# Patient Record
Sex: Female | Born: 1935 | Race: White | Hispanic: No | Marital: Married | Smoking: Never smoker
Health system: Southern US, Community
[De-identification: ages and names within clinical notes are randomized; demographics above are authoritative.]

## PROBLEM LIST (undated history)

## (undated) DIAGNOSIS — R0789 Other chest pain: Secondary | ICD-10-CM

## (undated) DIAGNOSIS — F09 Unspecified mental disorder due to known physiological condition: Secondary | ICD-10-CM

## (undated) DIAGNOSIS — I4891 Unspecified atrial fibrillation: Secondary | ICD-10-CM

## (undated) DIAGNOSIS — I639 Cerebral infarction, unspecified: Secondary | ICD-10-CM

## (undated) DIAGNOSIS — I4510 Unspecified right bundle-branch block: Secondary | ICD-10-CM

## (undated) DIAGNOSIS — C50919 Malignant neoplasm of unspecified site of unspecified female breast: Secondary | ICD-10-CM

## (undated) DIAGNOSIS — I272 Pulmonary hypertension, unspecified: Secondary | ICD-10-CM

## (undated) DIAGNOSIS — Z952 Presence of prosthetic heart valve: Secondary | ICD-10-CM

## (undated) DIAGNOSIS — Z8679 Personal history of other diseases of the circulatory system: Secondary | ICD-10-CM

## (undated) HISTORY — DX: Other chest pain: R07.89

## (undated) HISTORY — DX: Unspecified right bundle-branch block: I45.10

## (undated) HISTORY — DX: Cerebral infarction, unspecified: I63.9

## (undated) HISTORY — DX: Pulmonary hypertension, unspecified: I27.20

## (undated) HISTORY — DX: Unspecified mental disorder due to known physiological condition: F09

## (undated) HISTORY — DX: Presence of prosthetic heart valve: Z95.2

## (undated) HISTORY — PX: BREAST LUMPECTOMY: SHX2

## (undated) HISTORY — DX: Unspecified atrial fibrillation: I48.91

## (undated) HISTORY — DX: Personal history of other diseases of the circulatory system: Z86.79

## (undated) HISTORY — DX: Malignant neoplasm of unspecified site of unspecified female breast: C50.919

---

## 1983-01-11 HISTORY — PX: CHOLECYSTECTOMY: SHX55

## 1990-01-10 HISTORY — PX: MITRAL VALVE REPLACEMENT: SHX147

## 1997-11-21 ENCOUNTER — Encounter: Payer: Self-pay | Admitting: Specialist

## 1997-11-26 ENCOUNTER — Ambulatory Visit (HOSPITAL_COMMUNITY): Admission: RE | Admit: 1997-11-26 | Discharge: 1997-11-26 | Payer: Self-pay | Admitting: Specialist

## 1998-02-24 ENCOUNTER — Other Ambulatory Visit: Admission: RE | Admit: 1998-02-24 | Discharge: 1998-02-24 | Payer: Self-pay | Admitting: Obstetrics and Gynecology

## 1998-03-24 ENCOUNTER — Other Ambulatory Visit: Admission: RE | Admit: 1998-03-24 | Discharge: 1998-03-24 | Payer: Self-pay | Admitting: Surgery

## 1998-04-07 ENCOUNTER — Inpatient Hospital Stay (HOSPITAL_COMMUNITY): Admission: RE | Admit: 1998-04-07 | Discharge: 1998-04-16 | Payer: Self-pay | Admitting: Surgery

## 1998-04-07 ENCOUNTER — Encounter: Payer: Self-pay | Admitting: Surgery

## 1998-04-08 ENCOUNTER — Encounter: Payer: Self-pay | Admitting: Surgery

## 1998-04-10 ENCOUNTER — Encounter: Payer: Self-pay | Admitting: Surgery

## 1998-04-23 ENCOUNTER — Encounter: Admission: RE | Admit: 1998-04-23 | Discharge: 1998-07-22 | Payer: Self-pay | Admitting: Radiation Oncology

## 1999-03-02 ENCOUNTER — Other Ambulatory Visit: Admission: RE | Admit: 1999-03-02 | Discharge: 1999-03-02 | Payer: Self-pay | Admitting: Obstetrics and Gynecology

## 1999-06-16 ENCOUNTER — Ambulatory Visit (HOSPITAL_COMMUNITY): Admission: RE | Admit: 1999-06-16 | Discharge: 1999-06-16 | Payer: Self-pay | Admitting: Specialist

## 2000-01-06 ENCOUNTER — Encounter (INDEPENDENT_AMBULATORY_CARE_PROVIDER_SITE_OTHER): Payer: Self-pay | Admitting: Specialist

## 2000-01-06 ENCOUNTER — Ambulatory Visit (HOSPITAL_COMMUNITY): Admission: RE | Admit: 2000-01-06 | Discharge: 2000-01-06 | Payer: Self-pay | Admitting: *Deleted

## 2000-03-06 ENCOUNTER — Other Ambulatory Visit: Admission: RE | Admit: 2000-03-06 | Discharge: 2000-03-06 | Payer: Self-pay | Admitting: Obstetrics and Gynecology

## 2001-03-12 ENCOUNTER — Other Ambulatory Visit: Admission: RE | Admit: 2001-03-12 | Discharge: 2001-03-12 | Payer: Self-pay | Admitting: Obstetrics and Gynecology

## 2001-05-15 ENCOUNTER — Encounter (INDEPENDENT_AMBULATORY_CARE_PROVIDER_SITE_OTHER): Payer: Self-pay | Admitting: *Deleted

## 2001-05-15 ENCOUNTER — Encounter: Payer: Self-pay | Admitting: Surgery

## 2001-05-15 ENCOUNTER — Other Ambulatory Visit: Admission: RE | Admit: 2001-05-15 | Discharge: 2001-05-15 | Payer: Self-pay | Admitting: Diagnostic Radiology

## 2001-05-15 ENCOUNTER — Encounter: Admission: RE | Admit: 2001-05-15 | Discharge: 2001-05-15 | Payer: Self-pay | Admitting: Surgery

## 2001-06-10 ENCOUNTER — Encounter (INDEPENDENT_AMBULATORY_CARE_PROVIDER_SITE_OTHER): Payer: Self-pay | Admitting: *Deleted

## 2001-06-10 ENCOUNTER — Inpatient Hospital Stay (HOSPITAL_COMMUNITY): Admission: RE | Admit: 2001-06-10 | Discharge: 2001-06-12 | Payer: Self-pay | Admitting: Surgery

## 2001-06-10 ENCOUNTER — Encounter: Payer: Self-pay | Admitting: Cardiovascular Disease

## 2001-06-11 ENCOUNTER — Encounter: Payer: Self-pay | Admitting: Cardiovascular Disease

## 2001-06-25 ENCOUNTER — Ambulatory Visit: Admission: RE | Admit: 2001-06-25 | Discharge: 2001-09-23 | Payer: Self-pay | Admitting: Radiation Oncology

## 2001-10-24 ENCOUNTER — Ambulatory Visit: Admission: RE | Admit: 2001-10-24 | Discharge: 2001-10-24 | Payer: Self-pay | Admitting: Radiation Oncology

## 2004-10-14 ENCOUNTER — Ambulatory Visit: Payer: Self-pay | Admitting: Internal Medicine

## 2004-12-16 ENCOUNTER — Ambulatory Visit: Payer: Self-pay | Admitting: Oncology

## 2005-01-13 ENCOUNTER — Inpatient Hospital Stay (HOSPITAL_COMMUNITY): Admission: AD | Admit: 2005-01-13 | Discharge: 2005-01-30 | Payer: Self-pay | Admitting: Cardiovascular Disease

## 2005-01-14 ENCOUNTER — Ambulatory Visit: Payer: Self-pay | Admitting: Oncology

## 2005-01-14 ENCOUNTER — Encounter (INDEPENDENT_AMBULATORY_CARE_PROVIDER_SITE_OTHER): Payer: Self-pay | Admitting: *Deleted

## 2005-06-02 ENCOUNTER — Ambulatory Visit: Payer: Self-pay | Admitting: Oncology

## 2005-11-17 ENCOUNTER — Ambulatory Visit: Payer: Self-pay | Admitting: Oncology

## 2006-05-04 ENCOUNTER — Ambulatory Visit: Payer: Self-pay | Admitting: Oncology

## 2008-12-15 ENCOUNTER — Encounter: Admission: RE | Admit: 2008-12-15 | Discharge: 2008-12-15 | Payer: Self-pay | Admitting: Cardiovascular Disease

## 2008-12-24 DIAGNOSIS — R0789 Other chest pain: Secondary | ICD-10-CM

## 2008-12-24 HISTORY — DX: Other chest pain: R07.89

## 2010-03-25 ENCOUNTER — Other Ambulatory Visit: Payer: Self-pay | Admitting: Cardiovascular Disease

## 2010-03-25 ENCOUNTER — Ambulatory Visit
Admission: RE | Admit: 2010-03-25 | Discharge: 2010-03-25 | Disposition: A | Discharge status: Home / Self Care | Payer: Medicare Other | Source: Ambulatory Visit | Attending: Cardiovascular Disease | Admitting: Cardiovascular Disease

## 2010-03-25 DIAGNOSIS — R0602 Shortness of breath: Secondary | ICD-10-CM

## 2010-04-05 ENCOUNTER — Inpatient Hospital Stay (HOSPITAL_COMMUNITY)
Admission: AD | Admit: 2010-04-05 | Discharge: 2010-04-12 | DRG: 292 | Disposition: A | Discharge status: Home Health | Payer: Medicare Other | Source: Ambulatory Visit | Attending: Cardiovascular Disease | Admitting: Cardiovascular Disease

## 2010-04-05 ENCOUNTER — Inpatient Hospital Stay (HOSPITAL_COMMUNITY): Payer: Medicare Other

## 2010-04-05 DIAGNOSIS — I5023 Acute on chronic systolic (congestive) heart failure: Principal | ICD-10-CM | POA: Diagnosis present

## 2010-04-05 DIAGNOSIS — F0390 Unspecified dementia without behavioral disturbance: Secondary | ICD-10-CM | POA: Diagnosis present

## 2010-04-05 DIAGNOSIS — N183 Chronic kidney disease, stage 3 unspecified: Secondary | ICD-10-CM | POA: Diagnosis present

## 2010-04-05 DIAGNOSIS — E876 Hypokalemia: Secondary | ICD-10-CM | POA: Diagnosis present

## 2010-04-05 DIAGNOSIS — R188 Other ascites: Secondary | ICD-10-CM | POA: Diagnosis present

## 2010-04-05 DIAGNOSIS — Z8673 Personal history of transient ischemic attack (TIA), and cerebral infarction without residual deficits: Secondary | ICD-10-CM

## 2010-04-05 DIAGNOSIS — Z23 Encounter for immunization: Secondary | ICD-10-CM

## 2010-04-05 DIAGNOSIS — I4891 Unspecified atrial fibrillation: Secondary | ICD-10-CM | POA: Diagnosis present

## 2010-04-05 DIAGNOSIS — H3530 Unspecified macular degeneration: Secondary | ICD-10-CM | POA: Diagnosis present

## 2010-04-05 DIAGNOSIS — D649 Anemia, unspecified: Secondary | ICD-10-CM | POA: Diagnosis present

## 2010-04-05 DIAGNOSIS — Z7901 Long term (current) use of anticoagulants: Secondary | ICD-10-CM

## 2010-04-05 DIAGNOSIS — I509 Heart failure, unspecified: Secondary | ICD-10-CM | POA: Diagnosis present

## 2010-04-05 DIAGNOSIS — K7460 Unspecified cirrhosis of liver: Secondary | ICD-10-CM | POA: Diagnosis present

## 2010-04-05 DIAGNOSIS — I4510 Unspecified right bundle-branch block: Secondary | ICD-10-CM | POA: Diagnosis present

## 2010-04-05 DIAGNOSIS — Z853 Personal history of malignant neoplasm of breast: Secondary | ICD-10-CM

## 2010-04-05 LAB — AMMONIA: Ammonia: 16 umol/L (ref 11–35)

## 2010-04-05 LAB — DIFFERENTIAL
Basophils Absolute: 0.1 10*3/uL (ref 0.0–0.1)
Basophils Relative: 1 % (ref 0–1)
Basophils Relative: 1 % (ref 0–1)
Lymphocytes Relative: 11 % — ABNORMAL LOW (ref 12–46)
Lymphocytes Relative: 14 % (ref 12–46)
Monocytes Absolute: 0.8 10*3/uL (ref 0.1–1.0)
Monocytes Relative: 15 % — ABNORMAL HIGH (ref 3–12)
Monocytes Relative: 16 % — ABNORMAL HIGH (ref 3–12)
Neutro Abs: 3.3 10*3/uL (ref 1.7–7.7)
Neutro Abs: 3.6 10*3/uL (ref 1.7–7.7)
Neutrophils Relative %: 68 % (ref 43–77)
Neutrophils Relative %: 68 % (ref 43–77)

## 2010-04-05 LAB — CBC
HCT: 35.6 % — ABNORMAL LOW (ref 36.0–46.0)
HCT: 37.1 % (ref 36.0–46.0)
Hemoglobin: 11.3 g/dL — ABNORMAL LOW (ref 12.0–15.0)
Hemoglobin: 11.6 g/dL — ABNORMAL LOW (ref 12.0–15.0)
MCH: 28.1 pg (ref 26.0–34.0)
MCHC: 31.7 g/dL (ref 30.0–36.0)
RBC: 4.02 MIL/uL (ref 3.87–5.11)
RBC: 4.2 MIL/uL (ref 3.87–5.11)

## 2010-04-05 LAB — COMPREHENSIVE METABOLIC PANEL
Alkaline Phosphatase: 64 U/L (ref 39–117)
BUN: 31 mg/dL — ABNORMAL HIGH (ref 6–23)
Calcium: 9.4 mg/dL (ref 8.4–10.5)
Creatinine, Ser: 1.33 mg/dL — ABNORMAL HIGH (ref 0.4–1.2)
Glucose, Bld: 94 mg/dL (ref 70–99)
Total Protein: 6.7 g/dL (ref 6.0–8.3)

## 2010-04-05 LAB — BRAIN NATRIURETIC PEPTIDE: Pro B Natriuretic peptide (BNP): 338 pg/mL — ABNORMAL HIGH (ref 0.0–100.0)

## 2010-04-05 LAB — MAGNESIUM: Magnesium: 2 mg/dL (ref 1.5–2.5)

## 2010-04-05 LAB — TSH: TSH: 3.159 u[IU]/mL (ref 0.350–4.500)

## 2010-04-05 LAB — CARDIAC PANEL(CRET KIN+CKTOT+MB+TROPI)
CK, MB: 4.8 ng/mL — ABNORMAL HIGH (ref 0.3–4.0)
Relative Index: 2.3 (ref 0.0–2.5)
Total CK: 207 U/L — ABNORMAL HIGH (ref 7–177)
Troponin I: 0.05 ng/mL (ref 0.00–0.06)

## 2010-04-05 LAB — PROTIME-INR
INR: 4 — ABNORMAL HIGH (ref 0.00–1.49)
Prothrombin Time: 38.9 seconds — ABNORMAL HIGH (ref 11.6–15.2)

## 2010-04-06 LAB — URINALYSIS, ROUTINE W REFLEX MICROSCOPIC
Bilirubin Urine: NEGATIVE
Nitrite: NEGATIVE
Specific Gravity, Urine: 1.015 (ref 1.005–1.030)
Urobilinogen, UA: 0.2 mg/dL (ref 0.0–1.0)

## 2010-04-06 LAB — BASIC METABOLIC PANEL
BUN: 32 mg/dL — ABNORMAL HIGH (ref 6–23)
Calcium: 8.7 mg/dL (ref 8.4–10.5)
Chloride: 95 mEq/L — ABNORMAL LOW (ref 96–112)
Creatinine, Ser: 1.36 mg/dL — ABNORMAL HIGH (ref 0.4–1.2)

## 2010-04-06 LAB — PROTIME-INR: INR: 3.77 — ABNORMAL HIGH (ref 0.00–1.49)

## 2010-04-07 LAB — BASIC METABOLIC PANEL
BUN: 29 mg/dL — ABNORMAL HIGH (ref 6–23)
BUN: 29 mg/dL — ABNORMAL HIGH (ref 6–23)
Chloride: 93 mEq/L — ABNORMAL LOW (ref 96–112)
Chloride: 89 mEq/L — ABNORMAL LOW (ref 96–112)
GFR calc non Af Amer: 38 mL/min — ABNORMAL LOW (ref >60)
Glucose, Bld: 116 mg/dL — ABNORMAL HIGH (ref 70–99)
Glucose, Bld: 98 mg/dL (ref 70–99)
Potassium: 2.8 mEq/L — ABNORMAL LOW (ref 3.5–5.1)
Potassium: 3.4 mEq/L — ABNORMAL LOW (ref 3.5–5.1)

## 2010-04-07 LAB — VITAMIN B12: Vitamin B-12: 1202 pg/mL — ABNORMAL HIGH (ref 211–911)

## 2010-04-07 LAB — PROTIME-INR: Prothrombin Time: 33.3 seconds — ABNORMAL HIGH (ref 11.6–15.2)

## 2010-04-07 LAB — RPR: RPR Ser Ql: NONREACTIVE

## 2010-04-08 ENCOUNTER — Inpatient Hospital Stay (HOSPITAL_COMMUNITY): Payer: Medicare Other

## 2010-04-08 LAB — COMPREHENSIVE METABOLIC PANEL
AST: 39 U/L — ABNORMAL HIGH (ref 0–37)
Alkaline Phosphatase: 57 U/L (ref 39–117)
CO2: 36 mEq/L — ABNORMAL HIGH (ref 19–32)
Chloride: 92 mEq/L — ABNORMAL LOW (ref 96–112)
Creatinine, Ser: 1.29 mg/dL — ABNORMAL HIGH (ref 0.4–1.2)
GFR calc Af Amer: 49 mL/min — ABNORMAL LOW (ref >60)
GFR calc non Af Amer: 40 mL/min — ABNORMAL LOW (ref >60)
Total Bilirubin: 1.9 mg/dL — ABNORMAL HIGH (ref 0.3–1.2)

## 2010-04-08 LAB — CBC
HCT: 31.3 % — ABNORMAL LOW (ref 36.0–46.0)
Hemoglobin: 10 g/dL — ABNORMAL LOW (ref 12.0–15.0)
MCH: 28.1 pg (ref 26.0–34.0)
MCHC: 31.9 g/dL (ref 30.0–36.0)
MCV: 87.9 fL (ref 78.0–100.0)
Platelets: 125 10*3/uL — ABNORMAL LOW (ref 150–400)
RBC: 3.56 MIL/uL — ABNORMAL LOW (ref 3.87–5.11)
RDW: 16.2 % — ABNORMAL HIGH (ref 11.5–15.5)
WBC: 6.7 10*3/uL (ref 4.0–10.5)

## 2010-04-08 LAB — PROTIME-INR
INR: 3.3 — ABNORMAL HIGH (ref 0.00–1.49)
Prothrombin Time: 33.6 seconds — ABNORMAL HIGH (ref 11.6–15.2)

## 2010-04-08 LAB — BRAIN NATRIURETIC PEPTIDE: Pro B Natriuretic peptide (BNP): 231 pg/mL — ABNORMAL HIGH (ref 0.0–100.0)

## 2010-04-09 LAB — CBC
HCT: 34.2 % — ABNORMAL LOW (ref 36.0–46.0)
Hemoglobin: 11 g/dL — ABNORMAL LOW (ref 12.0–15.0)
MCH: 28.4 pg (ref 26.0–34.0)
MCHC: 32.2 g/dL (ref 30.0–36.0)
MCV: 88.1 fL (ref 78.0–100.0)

## 2010-04-09 LAB — BASIC METABOLIC PANEL
BUN: 31 mg/dL — ABNORMAL HIGH (ref 6–23)
CO2: 33 mEq/L — ABNORMAL HIGH (ref 19–32)
Chloride: 98 mEq/L (ref 96–112)
Glucose, Bld: 93 mg/dL (ref 70–99)
Potassium: 4 mEq/L (ref 3.5–5.1)

## 2010-04-09 LAB — PROTIME-INR
INR: 1.25 (ref 0.00–1.49)
Prothrombin Time: 29.4 seconds — ABNORMAL HIGH (ref 11.6–15.2)

## 2010-04-10 LAB — CBC
HCT: 33.3 % — ABNORMAL LOW (ref 36.0–46.0)
Hemoglobin: 10.6 g/dL — ABNORMAL LOW (ref 12.0–15.0)
MCHC: 31.8 g/dL (ref 30.0–36.0)
MCV: 87.6 fL (ref 78.0–100.0)
RDW: 16.4 % — ABNORMAL HIGH (ref 11.5–15.5)

## 2010-04-10 LAB — BASIC METABOLIC PANEL
CO2: 32 mEq/L (ref 19–32)
GFR calc Af Amer: 47 mL/min — ABNORMAL LOW (ref >60)
GFR calc non Af Amer: 39 mL/min — ABNORMAL LOW (ref >60)
Glucose, Bld: 107 mg/dL — ABNORMAL HIGH (ref 70–99)
Potassium: 3.8 mEq/L (ref 3.5–5.1)
Sodium: 139 mEq/L (ref 135–145)

## 2010-04-11 LAB — PROTIME-INR
INR: 3.08 — ABNORMAL HIGH (ref 0.00–1.49)
Prothrombin Time: 31.8 seconds — ABNORMAL HIGH (ref 11.6–15.2)

## 2010-04-11 LAB — BASIC METABOLIC PANEL
BUN: 38 mg/dL — ABNORMAL HIGH (ref 6–23)
Chloride: 95 mEq/L — ABNORMAL LOW (ref 96–112)
Creatinine, Ser: 1.24 mg/dL — ABNORMAL HIGH (ref 0.4–1.2)
GFR calc Af Amer: 51 mL/min — ABNORMAL LOW (ref >60)
GFR calc non Af Amer: 42 mL/min — ABNORMAL LOW (ref >60)
Potassium: 4.4 mEq/L (ref 3.5–5.1)

## 2010-04-12 LAB — BASIC METABOLIC PANEL
BUN: 51 mg/dL — ABNORMAL HIGH (ref 6–23)
Calcium: 8.7 mg/dL (ref 8.4–10.5)
GFR calc non Af Amer: 41 mL/min — ABNORMAL LOW (ref >60)
Glucose, Bld: 104 mg/dL — ABNORMAL HIGH (ref 70–99)
Potassium: 4 mEq/L (ref 3.5–5.1)

## 2010-04-12 LAB — PROTIME-INR
INR: 3.23 — ABNORMAL HIGH (ref 0.00–1.49)
Prothrombin Time: 33 seconds — ABNORMAL HIGH (ref 11.6–15.2)

## 2010-04-22 NOTE — Consult Note (Signed)
NAME:  [name] [name], [name] [name]                  ACCOUNT NO.:  [id number]  MEDICAL RECORD NO.:  [id number]           PATIENT TYPE:  I  LOCATION:  4730                         FACILITY:  [facility]  PHYSICIAN:  Dr. Thad Ranger           DATE OF BIRTH:  1932/12/06  DATE OF CONSULTATION:  04/07/2010 DATE OF DISCHARGE:                                CONSULTATION   TIME OF CONSULTATION:  1300.  REASON FOR CONSULTATION:  Progressive confusion.  HISTORY OF PRESENT ILLNESS:  This is a 77 year old Caucasian female with multiple medical problems including macular degeneration, right bundle- branch block, organic brain syndrome, heart failure, St. Jude valve placement for rheumatic fever in 1992, chronic AFib on Coumadin.  Over the past 3-4 months, the patient's family has noticed that the patient has had increasing confusion and periods of altered mental status.  Some of these are described as the patient's standing in a room with a frying pain and stating, "I do not know what to do." Other events include the patient going to the garage, looking for her purse, other events include the patient taking her medications, but telling her husband that she did not understand "why they need to be cooked."  Over the past few days, the patient's heart failure has worsened and she has become fluid overloaded.  In addition to her worsening heart failure, it was also noticed that the patient's confusion has increased and her short-term memory has declined.  At baseline, prior to the decline in the patient's memory, family member states that she does drive.  She does take part some of the finances in the house.  She does cooking and cleaning.  Her husband states that he does majority of the finances.  The patient appears to have no apparent problems with her ADLs at home.  PAST MEDICAL HISTORY: 1. Macular degeneration. 2. Right bundle-branch block. 3. Cholecystectomy. 4. Organic brain syndrome. 5. 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 6. Left ventricular dysfunction. 7. Colon polyps. 8. Breast cancer bilaterally. 9. St. Jude valve placement. 10.Mitral valve replacement in 1992 secondary to rheumatic heart     disease. 11.Chronic AFib on Coumadin.  MEDICATIONS:  Aspirin, digoxin, Lasix, metoprolol, potassium chloride, Coumadin per Pharmacy, Tylenol, Imodium, Zofran.  ALLERGIES:  TETRACYCLINE.  FAMILY HISTORY:  Noncontributory.  SOCIAL HISTORY:  The patient does not smoke, drink, or do illicit drugs. Lives with her husband.  She continues to drive, take care of all her activities of daily living.  Husband does the billing in the house, but she does cooking and cleaning.  REVIEW OF SYSTEMS:  Positive for lower extremity edema, shortness of breath, imbalance, decrease sensation in her lower extremities, declining memory otherwise negative.  PHYSICAL EXAMINATION:  VITAL SIGNS:  Blood pressure is 110/62, pulse 102, respirations 20, temperature 98.2. NEUROLOGIC:  Mental status:  The patient is alert and oriented x3, carries out two- and three-step commands.  The patient scored a 23 on her mini mental status exam, 1/4 on clock drawing, and was able to name  five animals on animal naming in 1 minute.  The patient's pupils are equal, round, reactive to light and accommodating, conjugate. Extraocular movements are intact.  Visual fields grossly intact.  Face symmetrical.  Tongue is midline.  Uvula is midline.  The patient shows no dysarthria or aphasia.  Facial sensation is full.  Pinprick, light touch, vibration, shoulder shrug, head turn is within normal limits. Coordination of finger-to-nose and heel-to-shin were smooth.  The patient's strength is 4/5 throughout.  Deep tendon reflexes were 1+ throughout with downgoing toes.  The patient showed no drift in her upper or lower extremities.  The patient's sensation was decreased in the lower extremities,  most notably mid calf to foot.  However, it should be noted that the patient also has significant pedal edema. PULMONARY:  Clear to auscultation bilaterally. CARDIOVASCULAR:  S1 and S2 were audible. NECK:  Negative for bruits.  Supple.  LABORATORY DATA:  PT is 33.3, INR is 3.27.  TSH 3.159, ammonia 16.  UA is negative.  Sodium 139, potassium 2.8, chloride 93, CO2 of 35, BUN 29, creatinine 1.36, glucose 116.  White blood cell count 5.3, platelets 153, hemoglobin 11.6, hematocrit 37.1.  IMAGING:  CT of head showed no evidence of acute infarct.  However, it did show small vessel disease type changes with vascular calcifications. Unfortunately, MRI is not an option at this point secondary to [facility]. Jude valve.  ASSESSMENT AND PLAN:  This is a 77 year old Caucasian female with progressive decline in memory in the setting of heart failure.  Most likely, this represents underlying neurodegenerative decline exacerbated by systemic illness.  At this point, would highly recommend the patient be seen on outpatient evaluation by a neurologist at [facility] for full cognitive evaluation.  At this time, we would not place the patient on any medication for dementia.  The decision to place the patient on Namenda, Aricept, or Exelon may be made on an outpatient basis once the patient's cognitive evaluation is complete.  A long discussion with the family has been made and they appeared to understand.  I have discussed this patient in depth with Dr. Thad Ranger and Dr. Thad Ranger has seen and evaluated this patient.  Dr. Thad Ranger agrees with the above-mentioned statements.     Felicie Morn, PA-C   ______________________________ Dr. Thad Ranger   DS/MEDQ  D:  04/07/2010  T:  04/08/2010  Job:  [id number]  Electronically Signed by Felicie Morn PA-C on 04/08/2010 02:36:18 PM Electronically Signed by Thana Farr MD on 04/22/2010 11:21:01 AM

## 2010-05-28 NOTE — Op Note (Signed)
[place]  Patient:    [name] [name], [name] [name] Visit Number: [id number] MRN: [id number]          Service Type: MED Location: 3700 [id number] Attending Physician:  Ruta Hinds Dictated by:   Currie Paris, M.D. Proc. Date: 06/10/01 Admit Date:  06/10/2001 Discharge Date: 06/12/2001   CC:         Harl Bowie, M.D.  [facility]  Richard A. [name], M.D.   Operative Report  VISIT NUMBER: [id number]  PREOPERATIVE DIAGNOSIS:  Carcinoma of right breast, upper inner quadrant.  POSTOPERATIVE DIAGNOSIS:  Carcinoma of right breast, upper inner quadrant.  OPERATION:  Needle-guided right partial mastectomy with blue dye injection and x-ray sentinel lymph node dissection.  SURGEON:  Currie Paris, M.D.  ANESTHESIA:  General.  CLINICAL HISTORY:  This patient is a 77 year old lady who has had a previous left breast cancer who recently presented with some calcifications and some increased fibrosis in the right breast about the 12 oclock position, but I think just slightly into the inner quadrant as opposed to the outer.  After core biopsy was accomplished, it was elected to proceed to needle-guided wide excision with sentinel node dissection, recognizing that there was a large area of abnormality on the mammogram which might well be fibrosis, but if it was carcinoma, it might require coming back for mastectomy.  DESCRIPTION OF PROCEDURE:  The patient was seen in the holding area and had no further questions.  She had already been injected with a radioactive nucleotide as well as having a guidewire placed at an X from the skin overlying the tumor.  The patient was taken to the operating room.  After satisfactory general anesthesia had been obtained, the breast was prepped and draped.  Just prior to prepping, I injected 4 cc of Lymphazurin blue subareolarly.  The axillary incision was made first and subcutaneous tissue  divided.  Using a NeoProbe, I had already identified the area to make the incision.  I found a blue lymphatic, traced it, and found almost immediately a single, about 1.5 cm node that was soft and blue and had counts of around 2000.  This was grasped with a Babcock and excised.  I saw lymphatic leaving this, traced it a little bit deeper, and using the NeoProbe, found another hot node that was very small and was also excised.  I could not find any other significant adenopathy by exam, palpation, or using the NeoProbe.  Packing was placed temporarily.  Attention was turned to the breast, and I made an elliptical incision to include the old biopsy scar and the guidewire entry site which was somewhat laterally and tracking medially.  I raised the skin flap a little bit superiorly and then went down to the chest wall, exposing pectoralis.  I then worked around the medial corner and then inferiorly, coming a little bit under the areola where there were a fair amount of fibrocystic type changes, and again, taking this all the way down to the chest wall.  I worked my way laterally and then divided it laterally at the guidewire entry site.  This was sent to check for margins.  The wound was irrigated and hemostasis achieved with the cautery.  I raised some of the breast up off of the chest wall.  I injected Marcaine in all the incisions using a total of 30 cc of 0.25%.  The breast was closed in layers with 3-0 Vicryl followed by 4-0 Monocryl  subcuticular.  The axilla was checked for hemostasis and closed in a similar fashion.  On pathology report, the nodes were negative as well as the margins.  The patient tolerated the procedure well.  There were no operative complications. All counts were correct. Dictated by:   Currie Paris, M.D. Attending Physician:  Ruta Hinds DD:  06/11/01 TD:  06/12/01 Job: [id number]

## 2010-05-28 NOTE — Discharge Summary (Signed)
[name]. [facility]  Patient:    [name], [name] Visit Number: [id number] MRN: [id number]          Service Type: MED Location: 3700 3734 [id number] Attending Physician:  Ruta Hinds Dictated by:   [facility] [place], [place]. Admit Date:  06/10/2001 Discharge Date: 06/12/2001   CC:         Currie Paris, M.D.  Foye Deer, M.D.   Discharge Summary  ADMISSION DIAGNOSES: 1. Cross over angicoagulation as patient had been on chronic Coumadin    therapy secondary to history of mitral valve replacement. 2. Chronic atrial fibrillation. 3. History of stroke. 4. History of mitral valve prolapse. 5. History of St. Jude valve August of 1995. 6. History of rheumatic heart disease. 7. History of embolic CVA December of 1992. 8. Chronic atrial fibrillation. 9. Chronic Coumadin therapy secondary to MVR, CVA, atrial fibrillation. 10.History of left sided breast lumpectomy, secondary to carcinoma and    radiation therapy. 11.Right breast carcinoma, newly diagnosed by Dr. Jamey Ripa and patient scheduled    for surgery June 11, 2001 by Dr. Jamey Ripa.  DISCHARGE DIAGNOSES: 1. Cross over angicoagulation as patient had been on chronic Coumadin    therapy secondary to history of mitral valve replacement. 2. Chronic atrial fibrillation. 3. History of stroke. 4. History of mitral valve prolapse. 5. History of St. Jude valve August of 1995. 6. History of rheumatic heart disease. 7. History of embolic CVA December of 1992. 8. Chronic atrial fibrillation. 9. Chronic Coumadin therapy secondary to MVR, CVA, atrial fibrillation. 10.History of left sided breast lumpectomy, secondary to carcinoma and    radiation therapy. 11.Right breast carcinoma, newly diagnosed by Dr. Jamey Ripa and patient schedueld    for surgery June 11, 2001 by Dr. Jamey Ripa. 12.Status post right partial mastectomy by Dr. Jamey Ripa May 15, 2001. 13.Anticoagulation bridging with Heparin, Lovenox, and Coumadin  postop.  HISTORY OF PRESENT ILLNESS: [name] [name] is a pleasant 77 year old white married female. She presented to the office on May 21, 2001 for evaluation by Dr. [name] and preparation for arrangements for angicoagulation and anticipation of her upcoming surgery for breast carcinoma by Dr. Jamey Ripa. She had a history of rheumatic heart disease and is status post mitral valve replacement in August of 2002 with a St. Jude valve. As well, she has a history of embolic stroke December 1990. She also has a history significant for chronic atrial fibrillation and is on chronic Coumadin anticoagulation therapy. Several years ago, she underwent left sided lumpectomy of the breast secondary to breast carcinoma and was treated with radiation therapy and had no known recurrence until recently. However, she was recently diagnosed to have right breast carcinoma which needed surgical resection and had been scheduled to be done on June 11, 2001 by Dr. Jamey Ripa. She was seen and evaluated by Dr. Susa Griffins in the office on May 21, 2001. It was felt that we would proceed with Coumadin and Lovenox. The last dose of her Coumadin will be Jun 06, 2001. She would be off Coumadin on Jun 07, 2001. Starting Jun 07, 2001 for three consecutive days which would be May 29, May 30, and May 31, she would be on Lovenox injections. She will then be admitted to the hospital on June 10, 2001 at which time she will start on IV Heparin drip and then would have her surgery on June 11, 2001.  HOSPITAL COURSE: On June 10, 2001, [name] [name] arrived to the hospital for preparation of surgery the following  day with Dr. Jamey Ripa. At that time, she was started on IV Heparin per pharmacy protocol.  PHYSICAL EXAM:  GENERAL: On exam at that time, she was stable.  VITAL SIGNS: BP 150/70. She was in atrial fibrillation about 80 beats per minute.  HOSPITAL COURSE CONTINUED: On June 11, 2001 she remained stable and was for surgery later in  the day. On June 11, 2001 she underwent right partial mastectomy by Dr. Jamey Ripa. She tolerated the procedure without complication. See his dictated report for details. On June 12, 2001 it was felt that she was stable for discharge home from Dr. Weldon Inches standpoint. On June 12, 2001 she was seen and evaluated by Dr. Nanetta Batty who deemed her stable for discharge to home. At this point, she is stable with blood pressure 160/70, pulse 84 and afebrile at 97. O2 sat is 95%. At this point, she is still on IV Heparin but is planned for Lovenox and Coumadin bridging.  CONSULTATIONS: Surgery consultation by Dr. Cyndia Bent.  PROCEDURES: Right breast partial mastectomy by Dr. Cyndia Bent on June 11, 2001.  LABORATORY DATA: On admission, white count was 6.6, hemoglobin 13.1, hematocrit 38.1, platelets 176. These values remained stable throughout the hospitalization with no significant variation. On admission, PT was 14.8, INR 1.2, PTT 28. Thereafter, the PTT was elevated on IV Heparin. Sodium 140, potassium 47, glucose 93, BUN 16, creatinine 1.1.  DIAGNOSTIC STUDIES: Right sentinel node biopsy on June 11, 2001 shows sentinel node injection, right breast. Chest x-ray on June 10, 2001 shows cardiomegaly. Previously CABG. Generalized prominence of peribronchial markings, consistent with bronchitis. No evidence of active infiltrate or metastatic disease. EKG shows atrial fibrillation at 67 beats per minute. Right bundle branch block.  DISCHARGE MEDICATIONS: 1. Lovenox 80 mg SQ every twelve hours. 2. Coumadin 7.5 mg once daily. 3. Digitec 0.25 mg alternating every other day. She takes one half tablet    alternating every other day with one tablet. 4. Caltrate plus D. 5. Vitamin E. 6. Furosemide 20 mg once daily. 7. Potassium chloride 10 meq twice a day. 8. Diltiazem XR 180 mg once daily. 9. Eye drops as before.  ACTIVITY: Per Dr. Jamey Ripa.  WOUND CARE: Per Dr. Jamey Ripa.   FOLLOW-UP: On  Friday morning, go to [facility] to check a STAT CBC, MB, and PT. Margaret Ashworth was going to follow-up with her Lovenox and Coumadin instructions and the PT and INR. I called to make an appointment with Dr. [name]. The office took her name and was going to call her back with the appointment. Dictated by:   [facility] [place], [place]. Attending Physician:  Ruta Hinds DD:  06/20/01 TD:  06/22/01 Job: [id number]

## 2010-05-28 NOTE — Consult Note (Signed)
NAME:  [name], [name]                  ACCOUNT NO.:  [id number]   MEDICAL RECORD NO.:  [id number]          PATIENT TYPE:  INP   LOCATION:  4714                         FACILITY:  [facility]   PHYSICIAN:  [name] [name], M.D.DATE OF BIRTH:  01-01-1933   DATE OF CONSULTATION:  01/14/2005  DATE OF DISCHARGE:                                   CONSULTATION   REFERRING PHYSICIAN:  Dr. Pearletha Furl. Alanda [name].   REASON FOR CONSULTATION:  Coumadin necrosis. 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  She is also T2, N0  left breast carcinoma diagnosed in March of 2000, status post XRT and  surgery.  We are awaiting [facility] records.  She was last seen in August of  2003, as she failed to follow up with [name] [name] on her due visit in  August of 2004.  Per chart report, she has been referred by Dr. Alanda [name]  for followup oncologic evaluation in [place], and seen by [name] [name] on  December 16, 2004.  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  She is  taking Coumadin for her atrial fibrillation, for her history of CVA, and for  her history of valve replacement.  She is to undergo a biopsy of her lesion  today.  Coumadin at this time is discontinued, since January 13, 2005, and  she is currently on heparin per Pharmacy.  Her PT is 2.3, INR 1.9 and PTT is  37.  All other pertinent labs are currently pending.  We were asked to see  the patient for evaluation of potential Coumadin necrosis.   PAST MEDICAL HISTORY:  1.  History of  bilateral breast cancer, as above.  2.  Macular degeneration.  3.  Depression.  4.  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  5.  History of colon polyps, followed up by colonoscopy, last procedure in      December of 2006 by Dr. __________ in [place], negative for malignancy.  6.  Rheumatic valve disease, status post St. Jude mitral valve replacement,      on chronic Coumadin and SBE prophylaxis.  7.  Chronic atrial fibrillation, on Coumadin.   SURGERIES:  1.  Status post MVR secondary to rheumatic valve disease, St. Jude, 1995,      [name] [name].  2.  Status post cholecystectomy in 1985.  3.  Status post right partial mastectomy, sentinel lymph node dissection on      June 10, 2001, [name] [name].  4.  Status post cataract extraction on the  left, with implant of lens in      June 2004.  5.  Status post left breast lumpectomy in year 2000.  6.  Status post tonsillectomy.   ALLERGIES:  TETRACYCLINE.   CURRENT MEDICATIONS:  1.  Calcium carbonate 500 mg daily.  2.  Keflex 500 mg twice daily.  3.  Lanoxin 0.38 mg and 0.25 mg every other day.  4.  Lasix 20 mg daily.  5.  Tylenol 650 mg q.4 h. p.r.n.  6.  Xanax p.r.n.  7.  Guaifenesin p.r.n.  8.  __________ p.r.n.  9.  Morphine sulfate as directed.  10. Zofran p.r.n.  11. Ambien p.r.n.   At home, the patient is on Coumadin, Digitek, Lasix, quinine for leg cramps  p.r.n.   REVIEW OF SYSTEMS:  See HPI for significant positive.  She denies any  fatigue or weight loss or anorexia.  No headaches or shortness of breath.  No chest pain or palpitations at this time.  She has chronic constipation.  No microscopic blood in the stools.  Of note, the patient states that 2  weeks prior to admission she had her Coumadin dose increased and Keflex was  taken 1 week prior to admission as well.   FAMILY HISTORY:  Family history, with the exception of paternal  grandmother  with breast cancer, the rest of the family history is negative for cancer.   SOCIAL HISTORY:  The patient is married.  She has 1 biological son, born  when she was 5 years old.  She also has 1 stepson.  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  No alcohol or tobacco history.  She lives in [place].  She is  [employer].   HEALTH MAINTENANCE:  Last mammogram was on August 23, 2004, negative.   PHYSICAL EXAMINATION:  GENERAL:  This is a 77 year old white female in no  acute distress, alert and oriented x3.  VITAL SIGNS:  Blood pressure 109/69, pulse 85, respirations 18, temperature  97.5, pulse oximetry 95% on room air.  Weight 152 pounds, height 64 inches.  HEENT:  Normocephalic, atraumatic.  PERRLA.  Oral mucosa without thrush or  lesions.  NECK:  Supple.  No cervical or supraclavicular masses.  LUNGS:  Clear to auscultation bilaterally.  No axillary masses.  CARDIOVASCULAR:  Regular rate and rhythm with occasional pulse, 1/3 systolic  murmur.  There is apparent split S2, no rubs or gallops.  ABDOMEN:  Soft and nontender.  Bowel sounds x4.  No palpable spleen or  liver.  GU AND RECTAL:  Deferred.  EXTREMITIES:  No clubbing or cyanosis.  No edema.  SKIN:  Skin showing 2 x 1.8 x 0.3-cm left anterior thigh dry, nonodorous  ulcer, as well as another similar ulcer in the left great toe, smaller,  about 1.3 cm, and another right fourth toe dry ulcer seen.   LABORATORY DATA:  Hemoglobin 12.6, hematocrit 36.6, white count 5.3,  platelets 184,000; neutrophils 4.5, MCV 86.2.  PT 22.3, PTT 37, INR 1.9.  Sed rate 7.  TSH 1.025.  Sodium 140, potassium 3.8, BUN 21, creatinine 1.0,  glucose 85, total bilirubin 1.0, alkaline phosphatase 67, AST 35, ALT 27,  total protein 6.7, albumin 4.2, calcium 9.1, magnesium 2.0.  Antithrombin  III enzyme 111.  Pending labs at this time are ANA, serum immunofixation,  urinary culture, protein C, protein gene mutation, cryoglobulin, factor V Leiden,  antiphospholipid antibody, and lupus anticoagulant.  As well,  ultrasound of the abdomen is pending.   ASSESSMENT  AND PLAN:  This is a 77 year old [name] [name] woman, initially  followed by [name] [name] with the history of breast cancer, T2, N0 in 2000,  and T1, N0 in 2003, refusing adjuvant hormone therapy both times, now  presenting with necrotic skin lesions, suspecting Coumadin skin necrosis.  She has been on a steady dose of warfarin at 7.5 mg a day, except for 1 day  a week, which is 10 mg, until 2-3 weeks ago, when she stated that her blood  was too thick and her dose was changed to 7.5 mg a day and 10 mg on  Thursday and Sunday.  Two weeks later, her blood was felt to be too thin, as  stated by her.  The next same day, before she stopped Coumadin for 24 hours,  she found a lesion on her left anterior thigh.  Her Coumadin dose was  decreased; nonetheless, 2 other lesions developed, 1 in each foot distally  on the plantar area.  Dr. Alanda [name] has already sent the hypercoagulable  panel, which is pending, as well as immunoglobulins, immunofixation, CEA,  ANA, and a Dermatology consult, which is currently pending.  [name] [name],  after careful examination and review of the chart, is not suspicious that  his warfarin skin necrosis, which, as Dr. Alanda [name] notes, typically occurs  when warfarin is initiated at high doses, not on a patient who has been on  Coumadin for greater than 12 years and whose warfarin dose has been  decreased.  However, given her history of mitral valve replacement:  A.  We  will continue with heparin pending the skin diagnosis.  B.  Check protein C  and S levels, although in this patient, who has been chronically on  Coumadin, would not establish a diagnosis of protein S or C deficiency.  C.  If the patient is to be positive for factor V Leiden mutation, the  possibility of Coumadin skin necrosis would be slightly likely.  D.  If the  patient is to be discharged,  would send home on Lovenox 100 mg  subcutaneously daily and slowly resume Coumadin until her PT and INR are  again therapeutic and no further lesions are noted.   At this time, doubt that warfarin is the cause of her skin lesions and also  do not believe that her history of breast cancer can contribute.  Will  follow with you.   Thank you very much for allowing [place] the opportunity to participate in the  care of Ms. Riles.      Marlowe Kays, P.A.      [name] [name], M.D.  Electronically Signed    SW/MEDQ  D:  01/18/2005  T:  01/18/2005  Job:  [id number]   cc:   Maryln Gottron, M.D.  Fax: [phone]   Dellia Beckwith, M.D.  Fax: [phone]   [place], [address] Tomasa Blase

## 2010-05-28 NOTE — Consult Note (Signed)
NAME:  [name] [name], [name] [name]                  ACCOUNT NO.:  [id number]   MEDICAL RECORD NO.:  [id number]          PATIENT TYPE:  INP   LOCATION:  4714                         FACILITY:  [facility]   PHYSICIAN:  Bernette Redbird, M.D.   DATE OF BIRTH:  Jan 08, 1933   DATE OF CONSULTATION:  01/14/2005  DATE OF DISCHARGE:                                   CONSULTATION   GASTROENTEROLOGY CONSULTATION.:   HISTORY OF PRESENT ILLNESS:  Dr. [name] asked [place] to see this very  pleasant 77 year old female to consider doing colonoscopy.   The patient has a history of a small colonic adenoma having been removed  five years ago by Dr. Luther Parody.  Her procedure is involved, because she is  on chronic anticoagulation for prosthetic mitral valve placed many years  ago. She has been on Coumadin for apparently more than 20 years.   Arrangements have been made for the procedure to be done on February 07, 2004,  as an outpatient by Dr. Bosie Clos, who had seen the patient recently  in the office, but the patient ended up getting admitted electively  yesterday on account of skin lesions which are thought to possibly represent  Coumadin necrosis.  Because of this, she is being taken off her Coumadin and  bridged with heparin while awaiting further evaluation.  This was felt to be  a very opportune time for performing her colonoscopy rather than having to  go through the same type of process again just a couple of weeks from now.   MEDICATIONS:  Outpatient medications include:  1.  Atacand.  2.  Diltiazem.  3.  Coumadin.  4.  Furosemide.  5.  Potassium.  6.  Digitek.  7.  Quinine.  8.  Multiple vitamins.   ALLERGIES:  Terramycin.   PAST SURGICAL HISTORY:  1.  Mitral valve replacement for rheumatic heart disease.  2.  Breast surgery bilaterally for breast cancer.  3.  Status post cholecystectomy.   PAST MEDICAL HISTORY:  1.  Rheumatic heart disease on chronic anticoagulation.  2.  History of breast cancer.  3.   History of macular degeneration.  4.  History of depression.  5.  History of TIAs.   PHYSICAL EXAMINATION:  GENERAL:  The patient appears neither anxious nor  depressed.  She is anicteric and without pallor.  The heart rate is about  88, blood pressure 109/71.  VITAL SIGNS:  She is afebrile.  CHEST:  The chest is clear to auscultation.  HEART:  Prosthetic valve sounds are crisp without any obvious murmurs.  The  rhythm is regular.   IMPRESSION:  The patient is medically fit to undergo colonoscopy once her  pro time drifts down.  Since the procedure it is not emergent and since she  will be in the hospital for awhile anyway, we will plan to do it on Monday  morning rather than bringing in the endoscopy team for an on-call procedure.           ______________________________  Bernette Redbird, M.D.     RB/MEDQ  D:  01/14/2005  T:  01/15/2005  Job:  [id number]   cc:   Barney Drain, M.D.   Richard A. [name], M.D.  Fax: [phone]   Shirley Friar, MD  Fax: [phone]

## 2010-05-28 NOTE — Op Note (Signed)
NAME:  [name] [name], [name] [name]                  ACCOUNT NO.:  [id number]   MEDICAL RECORD NO.:  [id number]          PATIENT TYPE:  INP   LOCATION:  4714                         FACILITY:  [facility]   PHYSICIAN:  James L. Malon Kindle., M.D.DATE OF BIRTH:  Feb 24, 1932   DATE OF PROCEDURE:  01/19/2005  DATE OF DISCHARGE:                                 OPERATIVE REPORT   PROCEDURE:  Colonoscopy.   MEDICATIONS GIVEN:  Ampicillin 2 grams, Tobramycin 80 mg, Fentanyl 75 mcg,  Versed 5 mg IV.   INDICATIONS FOR PROCEDURE:  Previous history of adenomatous polyps, this is  done as a follow up procedure.   DESCRIPTION OF PROCEDURE:  The procedure was explained and patient consent  obtained.  With the patient in the left lateral decubitus position, the  Olympus scope was inserted and advanced.  Heparin has been off for a couple  of hours.  The prep was excellent.  We were able to reach the cecum without  difficulty.  The ileocecal valve and appendiceal orifice was seen and the  scope was withdrawn.  The cecum, ascending, transverse, descending and  sigmoid colon were seen well.  No polyps seen.  A few scattered diverticula.  The rectum was free of polyps.  A few internal hemorrhoids.  The scope  withdrawn.  The patient tolerated the procedure well.   ASSESSMENT:  History of previous adenomatous colon polyps, negative  colonoscopy at this time, V12.72.   PLAN:  Will resume heparin, give one more dose of antibiotics, and will  recommend repeating the procedure in five years.           ______________________________  Llana Aliment Malon Kindle., M.D.     Waldron Session  D:  01/19/2005  T:  01/19/2005  Job:  [id number]   cc:   Gerlene Burdock A. [name], M.D.  Fax: [phone]

## 2010-05-28 NOTE — Op Note (Signed)
. [facility]  Patient:    [name] [name], [name] [name]                         MRN: [id number] Proc. Date: 06/16/99 Adm. Date:  [id number] Disc. Date: [id number] Attending:  Mick Sell                           Operative Report  PREOPERATIVE DIAGNOSIS:  Cataract, left eye.  POSTOPERATIVE DIAGNOSIS:  Cataract, left eye.  OPERATION PERFORMED:  Cataract extraction with intraocular lens implant, left eye.  SURGEON:  Chucky May, M.D.  INDICATIONS FOR SURGERY:  The patient is a 77 year old female with painless progressive decrease in vision so that she has difficulty seeing for reading. On examination the patient was found to have a dense nuclear sclerotic and cortical cataract consistent with the decrease in visual acuity.  DESCRIPTION OF PROCEDURE:  The patient was brought to the main operating room and placed in supine position.  Anesthesia was obtained by means of topical 4% lidocaine drops with tetracaine.  The patient was then prepped and draped in the usual manner.  A lid speculum was inserted and the cornea was entered with a diamond keratome superiorly with an additional port superior temporally. OcuCoat was instilled and an anterior capsulorrhexis was performed without difficulty.  The nucleus was mobilized and then phacoemulsified with residual cortical material removed by irrigation and aspiration.  The posterior capsule was polished and a posterior chamber lens implant was placed in the bag without difficulty.  OcuCoat was removed and replaced with balanced salt solution.  The wound was hydrated with balanced salt solution and checked for fluid leaks but none were noted.  The eye was dressed with topical Pred Forte, Ocuflox, Voltaren and a Fox shield and the patient was taken to the recovery room in excellent condition where she received written and verbal instructions for her postoperative care and was scheduled for follow up in 24 hours. DD:   07/07/99 TD:  07/08/99 Job: [id number]

## 2010-05-28 NOTE — Discharge Summary (Signed)
NAME:  [name] [name], [name] [name]                  ACCOUNT NO.:  [id number]   MEDICAL RECORD NO.:  [id number]          PATIENT TYPE:  INP   LOCATION:  4714                         FACILITY:  [facility]   PHYSICIAN:  Richard A. [name], M.D.DATE OF BIRTH:  15-May-1932   DATE OF ADMISSION:  01/13/2005  DATE OF DISCHARGE:  01/30/2005                                 DISCHARGE SUMMARY   DISCHARGE DIAGNOSES:  1.  Skin ulcers, not hought to be cholesterol emboli. Probably factitious      related to drain opening chemicals she spilled on herself. compatible      with the  nonspecific findings on pathology.      1.  Negative for Coumadin necrosis.  2.  Blood loss anemia secondary to biopsy site bleed and secondary to IV      heparin, resolved.  3.  St. Jude mitral valve replacement in 1992 secondary to rheumatic heart      disease.  4.  Chronic atrial fibrillation.  5.  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  6.  Anticoagulation.  7.  Urinary tract infection with hematuria treated at discharge.  8.  History of colon polyps undergoing colonoscopy this admission,      screening, negative pathology.  9.  History of bilateral breast cancer, stable and not contributing to      ulcers.  10. Mild left ventricular dysfunction, ejection fraction 45 to 50%.  11. Macular degeneration.  12. History of patent course in 1992, negative ischemia on the Cardiolite in      2005.  13. Right bundle branch block with left axis deviation.  14. Status post cholecystectomy.  15. 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   DISCHARGE MEDICATIONS:  1.  Furosemide 20 mg daily.  2.  K-Dur 10 mEq one daily.  3.  Lanoxin 0.25 mg one tablet alternating with half tablet every other day.  4.  Polysporin ointment, apply twice a day to all ulcerations, cover with      Band-Aid.  5.  Coumadin 7.5 mg on Sunday, January 30, 2005, 10 mg on Monday, January      22 , 2007, Tuesday 7.5 mg and have lab work done on Tuesday.  6.  Diltiazem XT 180 mg daily.  7.  Protonix 40 mg daily.  8.  Cipro 250 mg one twice a day x2 days.  9.  Atacand 4 mg one daily as before.  10. Calcium as before.  11. Quinine as before.   DISCHARGE INSTRUCTIONS:  1.  Low fat diet.  2.  Polysporin ointment.  3.  Follow-up with Dr. [name] in one month.  Office will call you with      date and time.  4.  Follow-up with Dr. Donzetta Starch this coming week. Our office will make the      appointment and call you for suture removal.  5.  Have Coumadin level checked on Tuesday of this week.   HISTORY OF PRESENT ILLNESS:  A  77 year old patient of Dr. [name] who  called our office on January 12, 2005, telling us that she needed to stop  her Coumadin due to Coumadin necrosis.  We did not think this was possible  secondary to St. Jude mitral valve and embolic CVA despite adequate  anticoagulation with chronic atrial fibrillation.  Therefore, we had her  come to [facility], stopped her Coumadin, put her on IV  heparin and planned for dermatology consult as well as hematology/oncology  consult.  In addition, she needed a screening colonoscopy that was being  planned for January as well and she would need to come off her Coumadin for  the colonoscopy.  We contacted GI for further plans concerning that.   Patient actually developed a white knot in her left thigh one week prior to  the admission. She was going to have her blood work drawn.  Her leg was  fine.  She put on her stockings and she had right thigh stinging.  It was  itching for about an hour after she had her blood drawn.  She checked her  leg and there was an ulcer on her lateral thigh.  Then on Friday, a similar  episode; this was the next day, but she developed stinging and burning on  her left great toe and another episode on the Saturday, on the right fourth   toe.  For three days in a row she developed sudden burning, stinging and  within an hour, small ulcers.  Originally, on her left thigh there was white  knot that turned into the ulcer.  Later, patient remembered a day or two  prior to this developing, cleaning pipe out with Drano but at the time of  using the Drano she had no stinging or burning.  This occurred two and even  four days later.  She was seen in [place], [place], originally by ER  physician, then a dermatologist and then a Careers adviser.  She was placed on  antibiotics.  She had some infection of the left great toe.   The patient gave a history a t discharge of using 'liquid plumber' type  compound to unclog a drain and spille on herself including her leg.  This  was about 2 days prior to the appearance of the lowe extremity ulcers and  thought at DC to be the cause of her problems.  No eclinical or path.  evidence of cholesterol emboli.   PAST MEDICAL HISTORY:  1.  She had a history of patent course in 1992. She has had no chest pain      since that time requiring a cardiac catheterization.  Her last      Cardiolite study was done April 2005, negative for ischemia.  She had a      2-D echo done May of 1976, EF was 45 to 50% with atrial fibrillation and      prosthetic mitral valve was well seated.  2.  History of chronic atrial fibrillation.  3.  Right bundle branch block with left axis deviation.  4.  History of embolic CVA despite being anticoagulated.  5.  She has a history of colon polyps in 2001 with Dr. Luther Parody and was      planning to have a repeat colonoscopy in January of this year.  6.  History of cholecystectomy.  7.  Remote bilateral breast cancer with lumpectomies.  She did receive      radiation in 2003.  8.  History of macular degeneration.  9l.  History of depression.  1.  TIA.   ALLERGIES:  TERRAMYCIN.  OUTPATIENT MEDICATIONS:  Essentially the same as inpatient with the addition  of Protonix and  Cipro.   FAMILY HISTORY:  Positive for colon polyps.   SOCIAL HISTORY/REVIEW OF SYSTEMS:  See H&P.   NOTE:  Patient has been on Coumadin since the 1980s.   PHYSICAL EXAMINATION AT DISCHARGE:  VITAL SIGNS:  Blood pressure 114/68,  pulse 75, respirations 20.  She was in atrial fibrillation.  Oxygen  saturation 95% on room air.  LUNGS:  Clear to auscultation.  CARDIOVASCULAR:  Regular rate and rhythm with crisp mitral valve closure.  ABDOMEN:  Positive bowel sounds.  EXTREMITIES:  Without edema.  No oozing at her left thigh biopsy site.   LABORATORY DATA:  On admission, hemoglobin 13.6, hematocrit 39.4, wbc 6.5,  platelets 216; neutrophils 70, lymphs 18, monos 10, eosinophils 2, baso 1.  She was on Keflex.  The smear revealed unremarkable morphology.  Hemoglobin  remained stable.  It, at one point, was as high as 15 with hematocrit of 43  and did get as low as 13.1 and hematocrit 38.   Stool for blood, two were negative, three were positive.  She is on  Protonix.  Coags on admission revealed INR 1.9.  She was placed on heparin  and followed on heparin for several days.  Coumadin was restarted at the  time of discharge.  Protime I do not have but the INR was 3.2.   At the time of discharge, hematocrit 39.5, wbc 5.4and platelets 165.   Chemistry on admission revealed sodium 139, potassium 3.7, chloride 106, CO2  30, glucose 85, BUN 19, creatinine 1.0, calcium 8.8, total protein 6.7,  albumin 4.2, AST 35, ALT 27, ALP 67, total bilirubin 1, magnesium 2.  These  remained stable throughout hospitalization.  At time of discharge, sodium  140, potassium 4.2, BUN 27, creatinine 1.1 and glucose 92.  Calcium was 9.   Lipid panel showed cholesterol 145, triglycerides 81, HDL 41, LDL 88.   TSH 1.025.   CEA 0.6.   Digoxin level originally was 0.9.  Follow-up was 0.7.   UA was not suggestive of UTI prior to discharge but we kept her on the Cipro  as she had significant hematuria prior to  that, will be for three days.  Protein C 1.54.  Antiphosphoid evaluation:  PTTLA 49.6, slightly elevated,  upper range of normal is 43.1, PTTLA confirmation 4.0, PTTLA 4:1 mix 43.9,  again slightly elevated.  DRVVT 56.7, again elevated, normal upper is 42.95.  DRVVT confirmation 1.25.  DRVVT 1:1 mix 44, slightly elevated.  Lupus  anticoagulant:  None detected.  Factor V Leiden mutation negative.  Protein  C 54.   Immunofixation:  Total protein 6.9, IgG serum 952, IgA serum 340, IgM serum  64.  Immunofixation interpretation:  No monoclonal protein identified.  Patient was O positive with negative antibody screen.  Original urinalysis  insignificant growth.  ANA was negative.  ANA phosphoid evaluation:  Please  see lab report.  Cryoglobulin evaluation was negative.  Prothrombin gene  mutation negative.   X-rays:  January 13, 2005, cardiomegaly with probable left atrial enlargement.  No acute cardiopulmonary disease.  January 14, 2005, abdominal  ultrasound due to a hard mass in the abdomen to evaluate for AAA.  No  abdominal mass seen by ultrasound.  There is no evidence of abdominal aortic  aneurysm, prominent common bile  duct most likely within normal limits post  cholecystectomy.   EKG:  Atrial fibrillation, right bundle branch block, left axis deviation.  On January 29, 2005, sinus rhythm with controlled atrial fibrillation.  There is a question of sinus rhythm first degree AV block with PR 274 msec.  On telemetry she had been irregularly irregular with atrial fibrillation.   HOSPITAL COURSE:  Ms. Baruch was admitted by Dr. [name] January 13, 2005,  to evaluate for Coumadin necrosis of her skin to decide how to manage her  mitral valve replacement, her chronic atrial fibrillation and history of  embolic CVA while anticoagulated.  In addition, she had been scheduled for  colonoscopy later in January while she was coming in to be taken off the  Coumadin, placed on heparin.  We felt  it was safe hospital time to go ahead  and do the colonoscopy at the same time if patient was stable.  Patient was  admitted, placed on IV heparin.  Dermatology consult was called as well as  oncology as patient had had a history of breast cancer.  Dr. Darnelle Catalan saw  her in follow-up.  Dr. Donzetta Starch was the dermatologist.  He did a biopsy of  her left thigh at the ulcer site.  Dr. Darnelle Catalan saw her and drew labs to  rule out Coumadin necrosis.  He did not feel her breast cancer had any  relation to new ulcers.   Patient was also seen by GI, Dr. Randa Evens and Dr. Matthias Hughs, to do colonoscopy.   Unfortunately, after her biopsy she started bleeding due to the IV heparin.  Lidocaine and epinephrine were injected which helped slow the bleeding for  24 hours but by the next morning, it started bleeding again.  Colonoscopy  was held at that time.  Dermatology saw  her again and placed three sutures  resulting in hemostasis.  Pressure bandage was applied.   After the labs returned, Dr. Darnelle Catalan did not feel she had any evidence for  Coumadin induced skin necrosis and the skin biopsy also did not show  Coumadin necrosis.  Dr. Darnelle Catalan felt it possibly could be a cholesterol  emboli.  Patient continued to slowly do well.  She underwent her  colonoscopy.  She underwent a colonoscopy without any complications and  Coumadin was added back to her medical regimen.  She stayed on the heparin  only off long enough to do her colonoscopy.  She continued to improve and to  be stable.  She did have more oozing from that thigh and on January 23, 2005, pressor was held.   She continued to do Coumadin and heparin crossover. We added a PPI secondary  to some heme-positive stools.   By January 29, 2004, it was felt she would be ready the next day when her  INR was therapeutic.  Unfortunately, on January 29, 2005, she started having hematuria.  Her heparin was discontinued.  INR was therapeutic for a valve.  We  kept her overnight for the UA.  We also started Cipro.   Patient did well and by January 30, 2005, she was ambulating, no bleeding  from her left thigh, no further hematuria, no complaints and quite anxious  to leave the hospital.  Will have her follow up with Dr. Donzetta Starch for left  thigh evaluation to remove those sutures.  She still has the areas on both  right and left toes from emboli.  She will see Dr. [name] back in a  approximately  a month.      Darcella Gasman. [place], N.P.      Richard A. [name], M.D.  Electronically Signed    LRI/MEDQ  D:  01/30/2005  T:  01/31/2005  Job:  [id number]   cc:   Rocco Serene, M.D.  Fax: [phone]   Valentino Hue. Magrinat, M.D.  Fax: [phone]   Llana Aliment. Malon Kindle., M.D.  Fax: [phone]   Dellia Beckwith, M.D.  Fax: [phone]   Maryln Gottron, M.D.  Fax: [phone]

## 2010-06-08 NOTE — Discharge Summary (Signed)
NAME:  [name] [name], [name] [name]                  ACCOUNT NO.:  [id number]  MEDICAL RECORD NO.:  [id number]           PATIENT TYPE:  I  LOCATION:  4730                         FACILITY:  [facility]  PHYSICIAN:  Grabiel Schmutz A. [name], M.D.DATE OF BIRTH:  11-Nov-1932  DATE OF ADMISSION:  04/05/2010 DATE OF DISCHARGE:  04/12/2010                              DISCHARGE SUMMARY   DISCHARGE DIAGNOSES: 1. Acute on chronic right heart failure, weight at discharge is 61.8     kg, weight on admission was 70 kg. 2. Cirrhosis, this was confirmed by abdominal ultrasound this     admission, LFTs were normal as was her ammonia level. 3. Mental status changes at this admission, we suspect this was from     acute medical decompensation on top of baseline organic brain     syndrome that usually is fairly well compensated.  She is improved     at discharge. 4. History of St. Jude mitral valve replacement in 1992 secondary to     rheumatic heart disease. 5. Moderate left ventricular dysfunction with an ejection fraction of     35-40% by echocardiogram March 2012. 6. History of normal coronaries. 7. Chronic atrial fibrillation, the patient did have an increased     heart rate this admission with milrinone which was improved off     milrinone. 8. Stage III renal insufficiency, creatinine has improved at 1.24 at     discharge. 9. Embolic cerebrovascular accident in the past. 10.History of right bundle-branch block. 11.History of breast cancer. 12.History of lower extremity ulcers in the past which we thought at     one point might be Coumadin necrosis, but this turned out to be     factitious and the patient had been using Liquid-Plumr which     probably cause the ulcerations. 13.Chronic anemia.  HOSPITAL COURSE:  The patient is a 77 year old female well-known to Dr. [name].  She had a St. Jude mitral valve replacement in 1992 for rheumatic heart disease.  She has pulmonary hypertension and has had enlarged liver  in the past and we felt she has had right heart failure which has been chronic.  She sees Dr. [name] regularly as an outpatient and he adjusted her diuretics.  She has actually done pretty well as an outpatient, she has not been hospitalized since 2007. Recently, he has been adjusting her diuretics for lower extremity edema and dyspnea and increasing weight.  She was seen in the office on April 05, 2010.  The patient's family was concerned that she had change in her baseline mental status.  Previously, the patient had been shopping and driving and now she was confused at times and seemed to be disoriented. Dr. [name] saw her in the office and felt she was in fluid overload. We admitted her to [facility] for IV diuresis and a neurologic evaluation.  On admission, serum ammonia level was obtained which was 16 which is within normal limits.  Abdominal ultrasound showed ascites and cirrhosis.  CT scan of her head was negative for acute infarct.  We started her on IV Lasix and then added  milrinone April 06, 2010.  She did diurese with milrinone.  We had the patient seen by the neurology service on 28.  They suspected she probably had acute decompensation in her baseline mental status because of her illness and suggested she be evaluated further by a neurologist as an outpatient.  The patient was kept on milrinone until the 31st.  While on milrinone, she did have increased heart rate up to 120s.  This improved off milrinone.  By the 31st, her BUN and creatinine had trended up to 37 and 1.34.  We held her diuretics one day and then resumed her on p.o. diuretics.  The patient improved and her mental status is much better on the second.  She is awake and alert and wants to the home.  Her weight today is 61.8 kg, weight on admission was 70 kg.  Her lower extremity edema has improved. She does have chronic venous changes in the lower extremities.  LABS AT DISCHARGE:  INR of 3.23.  Sodium  135, potassium 4.0, BUN 51, creatinine 1.28.  Stool was negative for blood.  White count 8.0, hemoglobin 10.6, hematocrit 33.3, platelets 149.  Serum ammonia level was 16.  BNP was 338.  TSH 3.19.  CK-MB and troponins were negative. RPR was nonreactive.  Chest x-ray on 29th showed mild heart failure, bilateral pleural effusions, right greater than left.  Ultrasound on 26th showed heart failure with bilateral pleural effusions, status post cholecystectomy, cirrhosis with prominent inferior vena cava and ascites.  CT of her head showed no hemorrhage or evidence of acute infarct and chronic small-vessel disease.  Please see med rec for complete discharge medications.  DISPOSITION:  The patient is discharged in stable condition.  She will have an INR done later this week.  She will see Dr. [name] the following week.     Abelino Derrick, P.A.   ______________________________ Pearletha Furl [name], M.D.    Lenard Lance  D:  04/12/2010  T:  04/13/2010  Job:  [id number]  cc:   [facility] Dellia Beckwith, M.D. Foye Deer, M.D. Vallery Ridge, M.D.  Electronically Signed by Corine Shelter P.A. on 04/15/2010 10:14:44 AM Electronically Signed by Susa Griffins M.D. on 06/08/2010 12:45:58 PM

## 2011-06-16 DIAGNOSIS — I272 Pulmonary hypertension, unspecified: Secondary | ICD-10-CM

## 2011-06-16 HISTORY — DX: Pulmonary hypertension, unspecified: I27.20

## 2012-02-22 ENCOUNTER — Encounter: Payer: Self-pay | Admitting: *Deleted

## 2012-02-22 DIAGNOSIS — I4510 Unspecified right bundle-branch block: Secondary | ICD-10-CM

## 2012-02-22 DIAGNOSIS — I4891 Unspecified atrial fibrillation: Secondary | ICD-10-CM

## 2012-02-22 DIAGNOSIS — I509 Heart failure, unspecified: Secondary | ICD-10-CM

## 2012-02-22 DIAGNOSIS — I099 Rheumatic heart disease, unspecified: Secondary | ICD-10-CM

## 2012-02-22 DIAGNOSIS — I639 Cerebral infarction, unspecified: Secondary | ICD-10-CM | POA: Insufficient documentation

## 2012-04-27 ENCOUNTER — Encounter: Payer: Self-pay | Admitting: Pharmacist Clinician (PhC)/ Clinical Pharmacy Specialist

## 2012-04-27 DIAGNOSIS — Z952 Presence of prosthetic heart valve: Secondary | ICD-10-CM

## 2012-04-27 DIAGNOSIS — Z7901 Long term (current) use of anticoagulants: Secondary | ICD-10-CM

## 2012-04-27 DIAGNOSIS — I639 Cerebral infarction, unspecified: Secondary | ICD-10-CM

## 2012-04-27 DIAGNOSIS — I4891 Unspecified atrial fibrillation: Secondary | ICD-10-CM

## 2012-05-24 ENCOUNTER — Ambulatory Visit (INDEPENDENT_AMBULATORY_CARE_PROVIDER_SITE_OTHER): Payer: Self-pay | Admitting: Pharmacist Clinician (PhC)/ Clinical Pharmacy Specialist

## 2012-05-24 DIAGNOSIS — I4891 Unspecified atrial fibrillation: Secondary | ICD-10-CM

## 2012-05-24 DIAGNOSIS — I639 Cerebral infarction, unspecified: Secondary | ICD-10-CM

## 2012-05-24 DIAGNOSIS — I6350 Cerebral infarction due to unspecified occlusion or stenosis of unspecified cerebral artery: Secondary | ICD-10-CM

## 2012-05-24 DIAGNOSIS — Z952 Presence of prosthetic heart valve: Secondary | ICD-10-CM

## 2012-05-24 DIAGNOSIS — Z954 Presence of other heart-valve replacement: Secondary | ICD-10-CM

## 2012-05-24 DIAGNOSIS — Z7901 Long term (current) use of anticoagulants: Secondary | ICD-10-CM

## 2012-06-21 ENCOUNTER — Other Ambulatory Visit (HOSPITAL_COMMUNITY): Payer: Self-pay | Admitting: Cardiovascular Disease

## 2012-06-21 ENCOUNTER — Ambulatory Visit (INDEPENDENT_AMBULATORY_CARE_PROVIDER_SITE_OTHER): Payer: Medicare Other | Admitting: Pharmacist Clinician (PhC)/ Clinical Pharmacy Specialist

## 2012-06-21 ENCOUNTER — Other Ambulatory Visit: Payer: Self-pay | Admitting: Cardiovascular Disease

## 2012-06-21 DIAGNOSIS — I4891 Unspecified atrial fibrillation: Secondary | ICD-10-CM

## 2012-06-21 DIAGNOSIS — I6350 Cerebral infarction due to unspecified occlusion or stenosis of unspecified cerebral artery: Secondary | ICD-10-CM

## 2012-06-21 DIAGNOSIS — Z954 Presence of other heart-valve replacement: Secondary | ICD-10-CM

## 2012-06-21 DIAGNOSIS — Z7901 Long term (current) use of anticoagulants: Secondary | ICD-10-CM

## 2012-06-21 DIAGNOSIS — Z952 Presence of prosthetic heart valve: Secondary | ICD-10-CM

## 2012-06-21 DIAGNOSIS — I639 Cerebral infarction, unspecified: Secondary | ICD-10-CM

## 2012-06-21 LAB — COMPREHENSIVE METABOLIC PANEL
CO2: 32 mEq/L (ref 19–32)
Creat: 1.35 mg/dL — ABNORMAL HIGH (ref 0.50–1.10)
Glucose, Bld: 93 mg/dL (ref 70–99)
Total Bilirubin: 2 mg/dL — ABNORMAL HIGH (ref 0.3–1.2)
Total Protein: 7.2 g/dL (ref 6.0–8.3)

## 2012-06-21 LAB — CBC WITH DIFFERENTIAL/PLATELET
Eosinophils Absolute: 0.1 10*3/uL (ref 0.0–0.7)
Eosinophils Relative: 1 % (ref 0–5)
Hemoglobin: 12 g/dL (ref 12.0–15.0)
Lymphs Abs: 1.1 10*3/uL (ref 0.7–4.0)
MCH: 30.8 pg (ref 26.0–34.0)
MCV: 86.2 fL (ref 78.0–100.0)
Monocytes Relative: 13 % — ABNORMAL HIGH (ref 3–12)
RBC: 3.9 MIL/uL (ref 3.87–5.11)

## 2012-06-21 LAB — TSH: TSH: 2.956 u[IU]/mL (ref 0.350–4.500)

## 2012-06-21 LAB — MAGNESIUM: Magnesium: 2.4 mg/dL (ref 1.5–2.5)

## 2012-06-22 LAB — BRAIN NATRIURETIC PEPTIDE: Brain Natriuretic Peptide: 152.7 pg/mL — ABNORMAL HIGH (ref 0.0–100.0)

## 2012-06-25 IMAGING — CR DG CHEST 2V
2 series · 2 of 2 positions shown · non-contrast
Comparison: Chest x-ray 01/13/2005

CLINICAL DATA: Shortness of breath.

CHEST - 2 VIEW

[w chest pa]
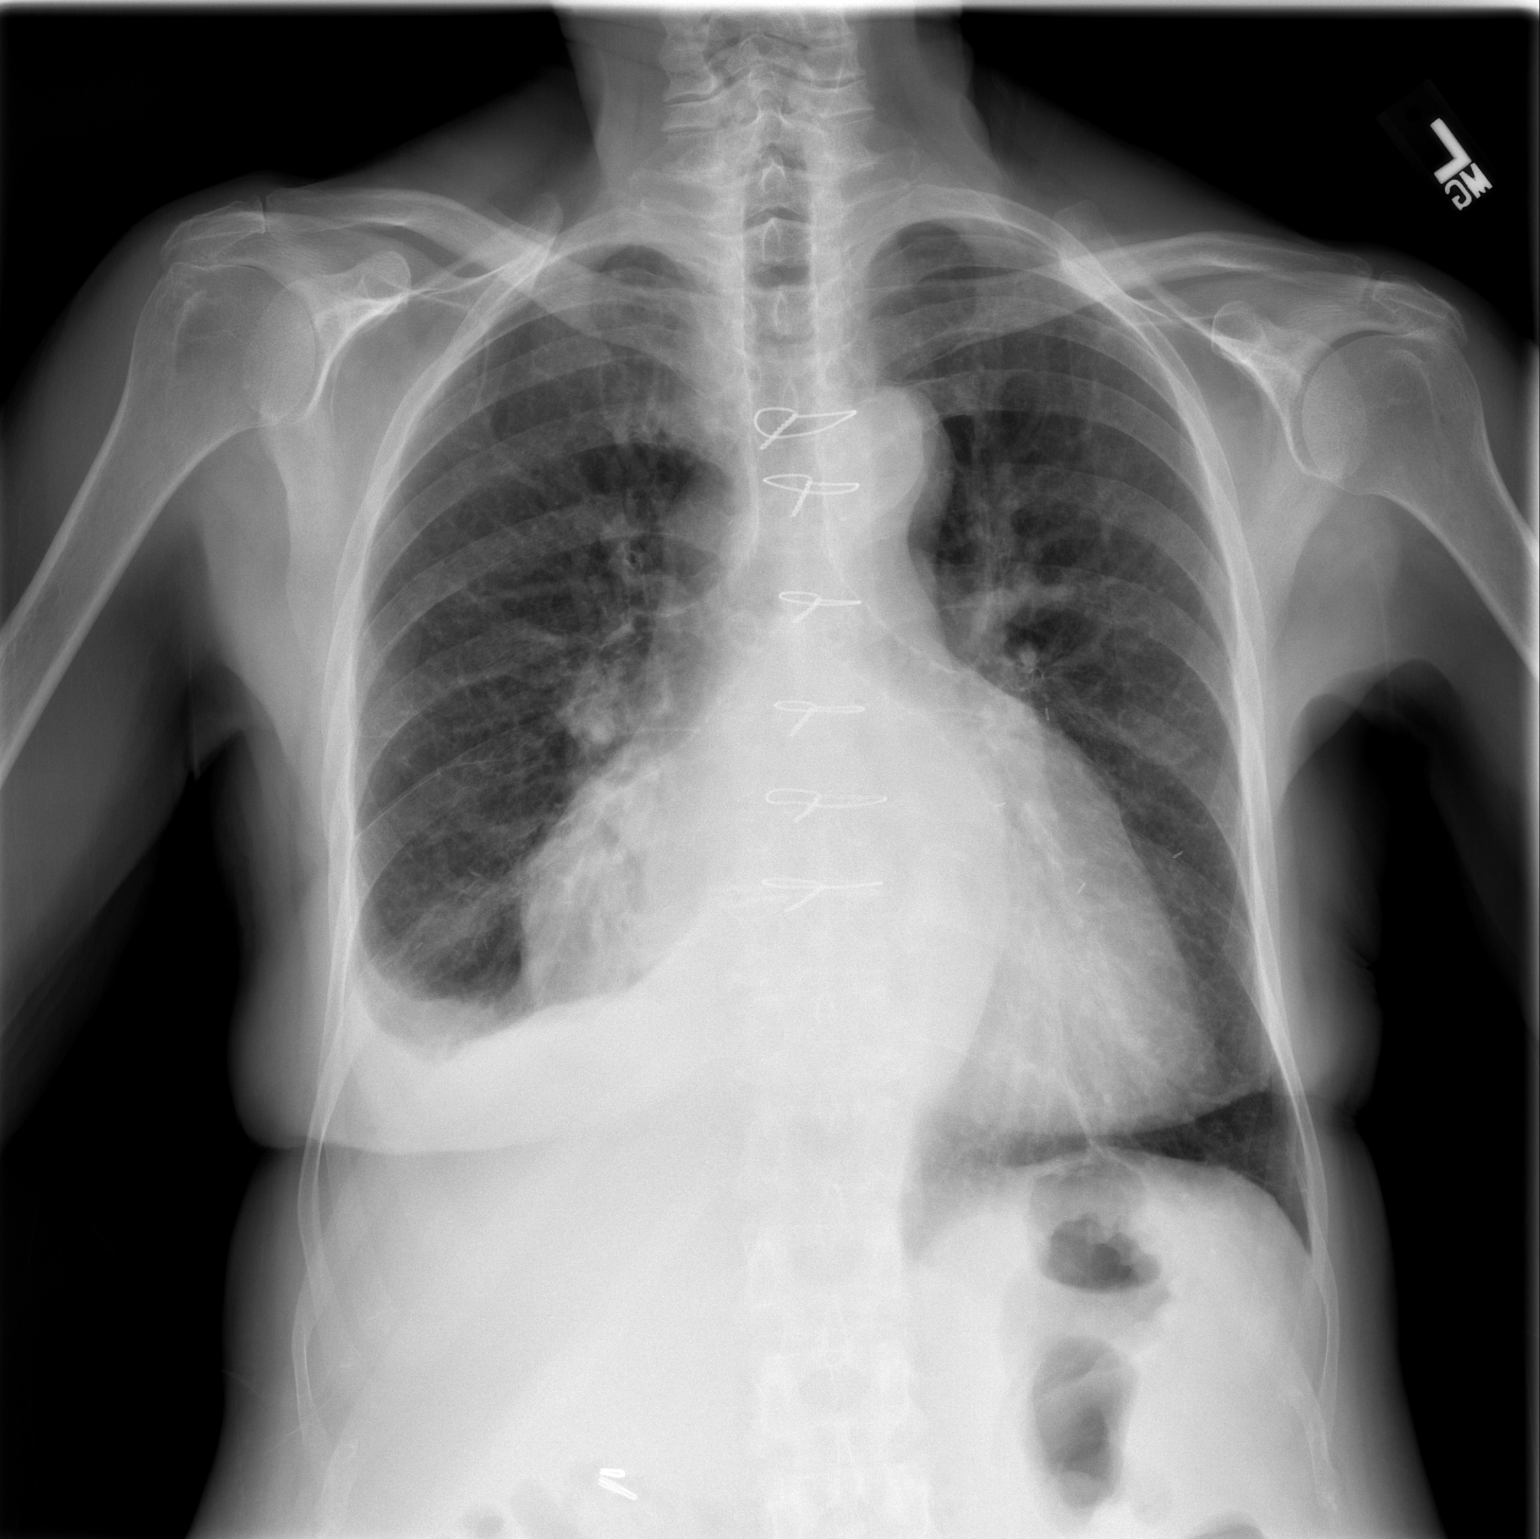

[w chest lat]
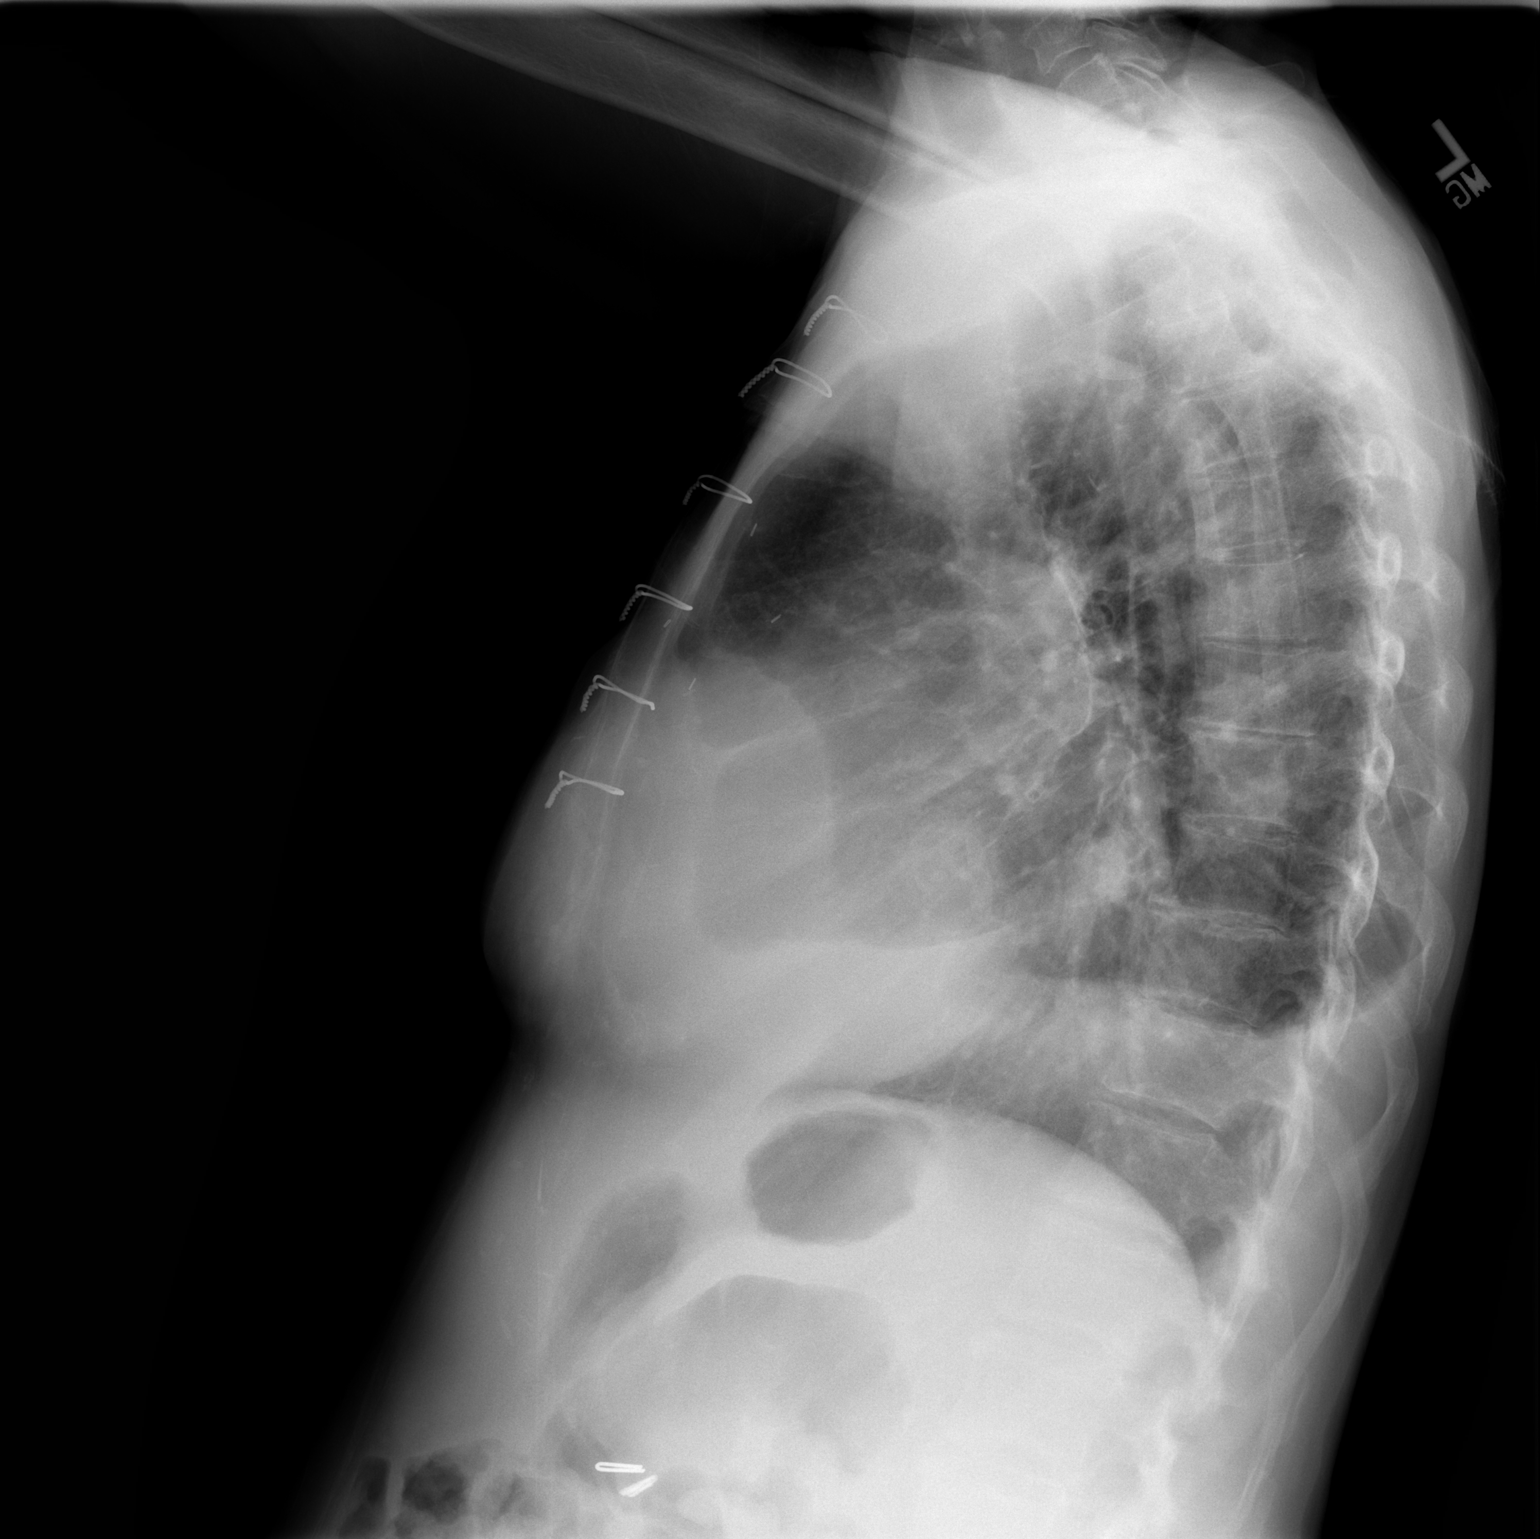

[2 of 2 positions shown; findings below may reference images not displayed]

FINDINGS: The heart is enlarged but stable.  There is mild
tortuosity of the thoracic aorta.  The pulmonary hila appear normal
and stable.  There is mild vascular congestion but no overt
pulmonary edema.  A small right pleural effusion is noted with
overlying atelectasis.  The bony thorax is intact and appears
stable.
IMPRESSION: 1.  Cardiac enlargement and mild central vascular congestion. No
overt pulmonary edema.
2.  Small right pleural effusion and overlying atelectasis.

## 2012-06-27 ENCOUNTER — Encounter: Payer: Self-pay | Admitting: Cardiovascular Disease

## 2012-07-06 ENCOUNTER — Ambulatory Visit (HOSPITAL_COMMUNITY): Payer: Medicare Other

## 2012-07-06 IMAGING — CT CT HEAD W/O CM
1 of 2 series · 13 of 30 positions shown, 17 images · non-contrast
Comparison: None.

CLINICAL DATA: Altered mental status.

CT HEAD WITHOUT CONTRAST
TECHNIQUE: Contiguous axial images were obtained from the base of
the skull through the vertex without contrast.

[Series 2: brain · axial · 0.47mm/px · z∈[+129,+257]mm · 13 of 28 slices shown, 17 images]
[im 2/28  brain]
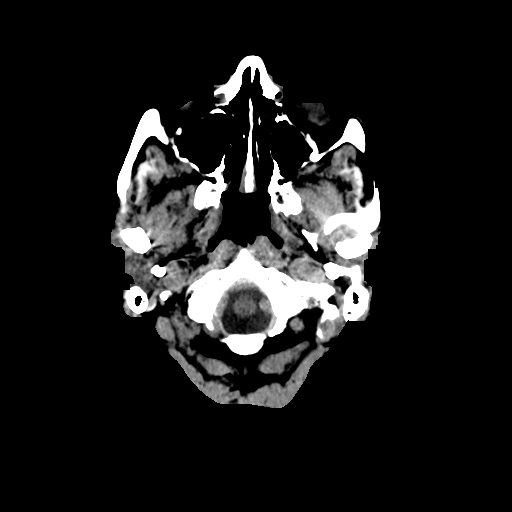
[im 2/28  bone]
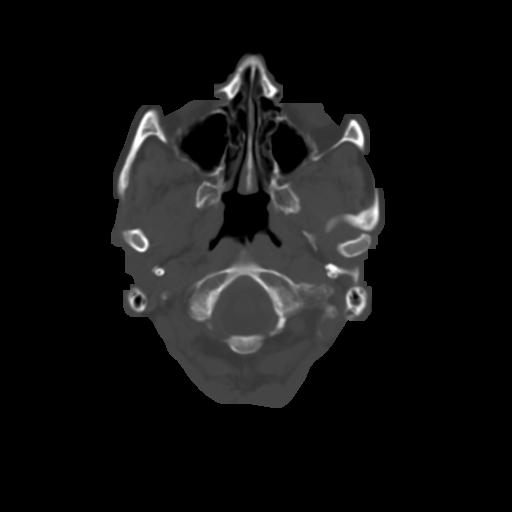
[im 4/28  brain]
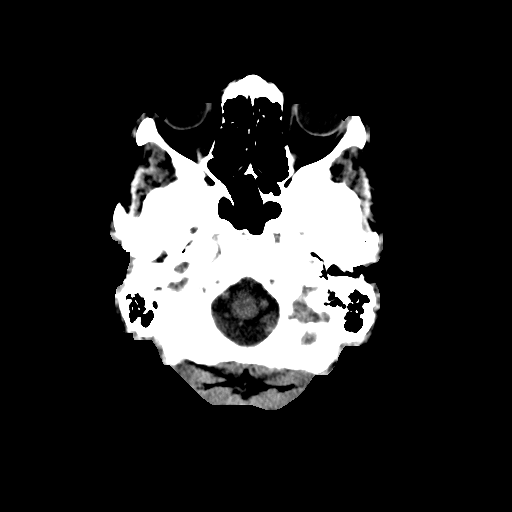
[im 6/28  brain]
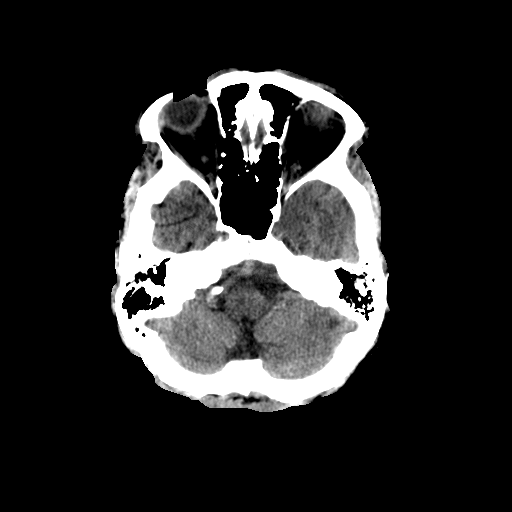
[im 8/28  brain]
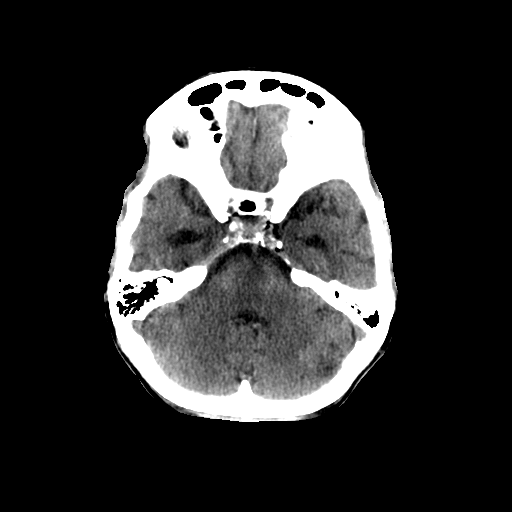
[im 10/28  brain]
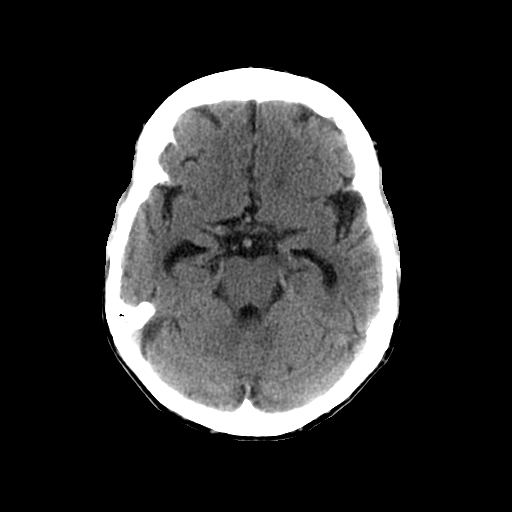
[im 10/28  bone]
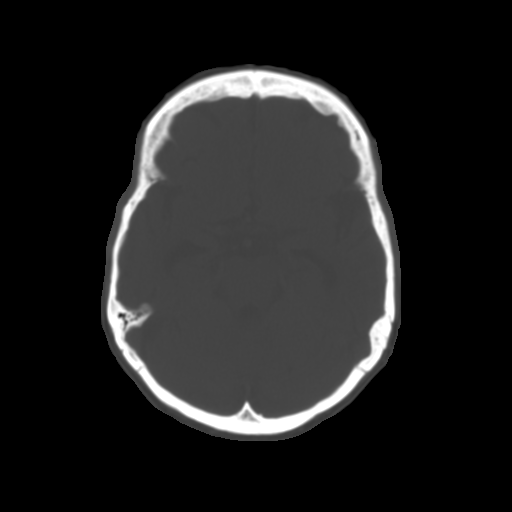
[im 12/28  brain]
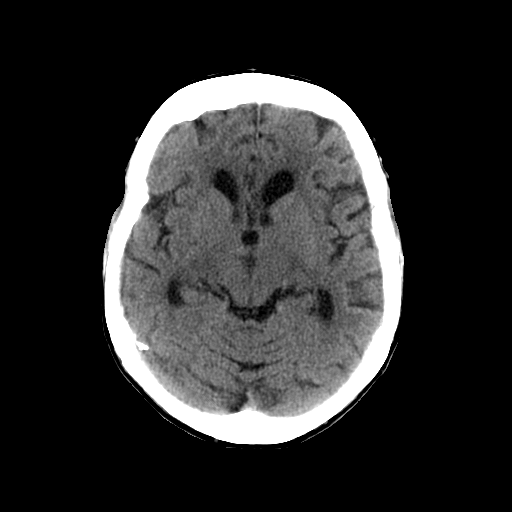
[im 14/28  brain]
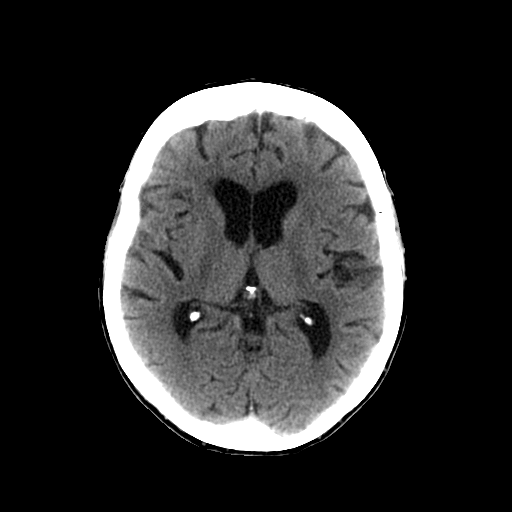
[im 16/28  brain]
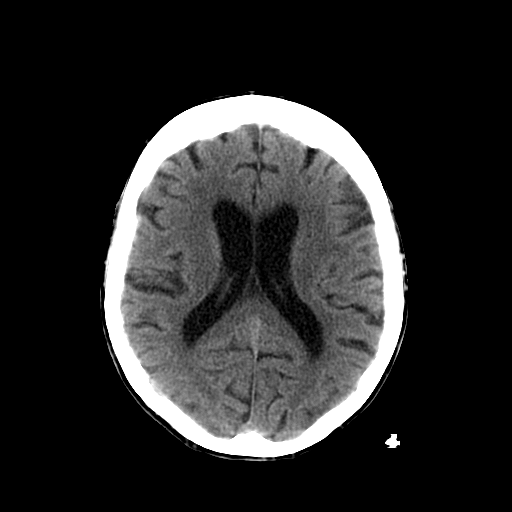
[im 18/28  brain]
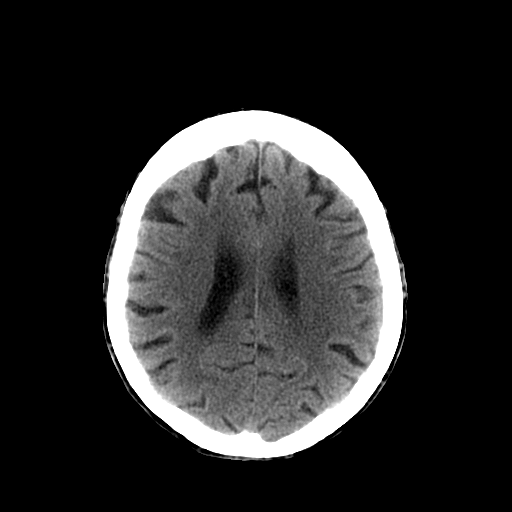
[im 18/28  bone]
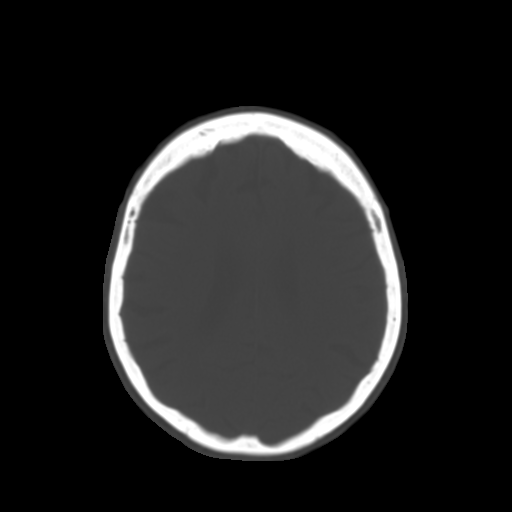
[im 20/28  brain]
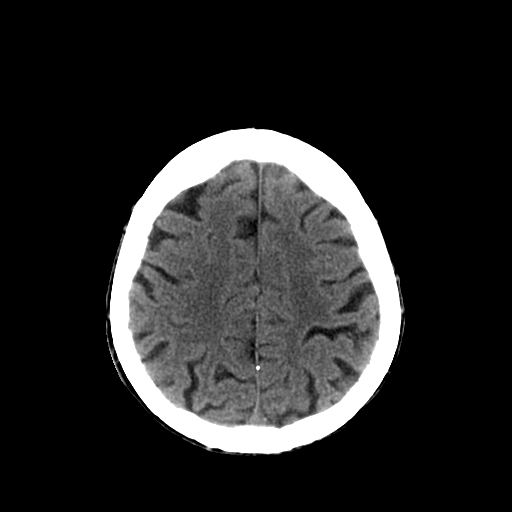
[im 22/28  brain]
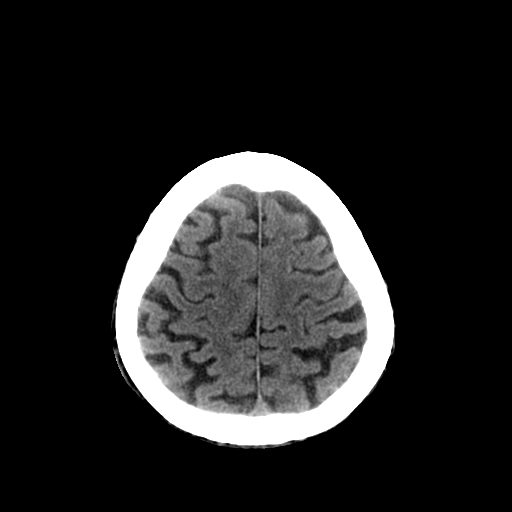
[im 24/28  brain]
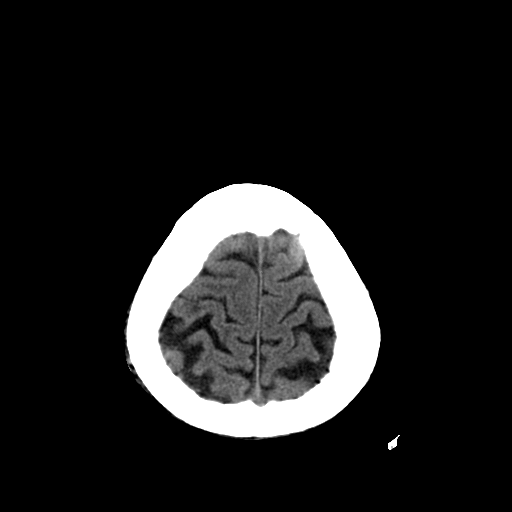
[im 26/28  brain]
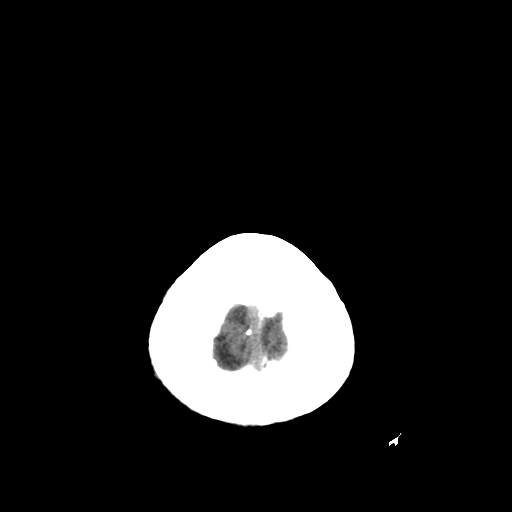
[im 26/28  bone]
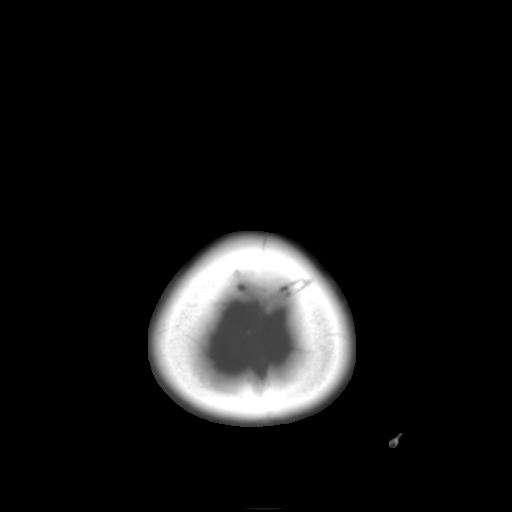

[13 of 30 positions shown; findings below may reference images not displayed]

FINDINGS: No intracranial hemorrhage. No intracranial mass
detected on this unenhanced exam.

Small vessel disease type changes.  Streak artifact posterior
fossa. No CT evidence of large acute infarct.  Small acute infarct
cannot be excluded by CT.  Vascular calcifications.

Ventricular prominence felt to be related to atrophy rather
hydrocephalus.

Visualized sinuses and mastoid air cells are clear.
IMPRESSION: No intracranial hemorrhage or CT evidence of large acute infarct.

Small vessel disease type changes.

Vascular calcifications.

Please see above.

## 2012-07-10 ENCOUNTER — Ambulatory Visit (INDEPENDENT_AMBULATORY_CARE_PROVIDER_SITE_OTHER): Payer: Self-pay | Admitting: Pharmacist Clinician (PhC)/ Clinical Pharmacy Specialist

## 2012-07-10 ENCOUNTER — Ambulatory Visit (HOSPITAL_COMMUNITY)
Admission: RE | Admit: 2012-07-10 | Discharge: 2012-07-10 | Disposition: A | Discharge status: Home / Self Care | Payer: Medicare Other | Source: Ambulatory Visit | Attending: Cardiology | Admitting: Cardiology

## 2012-07-10 DIAGNOSIS — I639 Cerebral infarction, unspecified: Secondary | ICD-10-CM

## 2012-07-10 DIAGNOSIS — Z954 Presence of other heart-valve replacement: Secondary | ICD-10-CM

## 2012-07-10 DIAGNOSIS — I050 Rheumatic mitral stenosis: Secondary | ICD-10-CM | POA: Insufficient documentation

## 2012-07-10 DIAGNOSIS — Z952 Presence of prosthetic heart valve: Secondary | ICD-10-CM

## 2012-07-10 DIAGNOSIS — Z8673 Personal history of transient ischemic attack (TIA), and cerebral infarction without residual deficits: Secondary | ICD-10-CM | POA: Insufficient documentation

## 2012-07-10 DIAGNOSIS — I079 Rheumatic tricuspid valve disease, unspecified: Secondary | ICD-10-CM | POA: Insufficient documentation

## 2012-07-10 DIAGNOSIS — I4891 Unspecified atrial fibrillation: Secondary | ICD-10-CM | POA: Insufficient documentation

## 2012-07-10 DIAGNOSIS — I509 Heart failure, unspecified: Secondary | ICD-10-CM | POA: Insufficient documentation

## 2012-07-10 DIAGNOSIS — Z7901 Long term (current) use of anticoagulants: Secondary | ICD-10-CM

## 2012-07-10 DIAGNOSIS — I6350 Cerebral infarction due to unspecified occlusion or stenosis of unspecified cerebral artery: Secondary | ICD-10-CM

## 2012-07-10 NOTE — Progress Notes (Signed)
[facility]   2D echo completed 07/10/2012.   Cindy Kinda Pottle, RDCS  

## 2012-07-11 ENCOUNTER — Telehealth: Payer: Self-pay | Admitting: Cardiovascular Disease

## 2012-07-11 NOTE — Telephone Encounter (Signed)
Message forwarded to K. Vogel, RN.  

## 2012-07-11 NOTE — Telephone Encounter (Signed)
Spoke with [name] [name] and gave bloodwork results

## 2012-07-11 NOTE — Telephone Encounter (Signed)
Returning somebody call from yesterday-concerning her lab results!

## 2012-07-18 ENCOUNTER — Telehealth: Payer: Self-pay | Admitting: Cardiovascular Disease

## 2012-07-18 NOTE — Telephone Encounter (Signed)
Need a lab order so she can have lab work  At [facility]!

## 2012-07-18 NOTE — Telephone Encounter (Signed)
Talked to pt.s husband . Lab slip to be faxed to [facility] for a bmp to be done as a recheck from last month.

## 2012-07-20 ENCOUNTER — Ambulatory Visit (INDEPENDENT_AMBULATORY_CARE_PROVIDER_SITE_OTHER): Payer: Medicare Other | Admitting: Pharmacist Clinician (PhC)/ Clinical Pharmacy Specialist

## 2012-07-20 DIAGNOSIS — Z7901 Long term (current) use of anticoagulants: Secondary | ICD-10-CM

## 2012-07-20 DIAGNOSIS — I639 Cerebral infarction, unspecified: Secondary | ICD-10-CM

## 2012-07-20 DIAGNOSIS — I6350 Cerebral infarction due to unspecified occlusion or stenosis of unspecified cerebral artery: Secondary | ICD-10-CM

## 2012-07-20 DIAGNOSIS — Z952 Presence of prosthetic heart valve: Secondary | ICD-10-CM

## 2012-07-20 DIAGNOSIS — Z954 Presence of other heart-valve replacement: Secondary | ICD-10-CM

## 2012-07-20 DIAGNOSIS — I4891 Unspecified atrial fibrillation: Secondary | ICD-10-CM

## 2012-07-23 ENCOUNTER — Telehealth: Payer: Self-pay | Admitting: Cardiovascular Disease

## 2012-07-23 NOTE — Telephone Encounter (Signed)
[name] [name] , Mr. Weberg is wanting to know if you change the dosage on his wife coumadin wants to make sure .Marland Kitchen Please call    Thanks

## 2012-07-23 NOTE — Telephone Encounter (Signed)
Gave correct dose information 7.5mg  qd

## 2012-08-02 ENCOUNTER — Ambulatory Visit (INDEPENDENT_AMBULATORY_CARE_PROVIDER_SITE_OTHER): Payer: Self-pay | Admitting: Pharmacist Clinician (PhC)/ Clinical Pharmacy Specialist

## 2012-08-02 DIAGNOSIS — Z952 Presence of prosthetic heart valve: Secondary | ICD-10-CM

## 2012-08-02 DIAGNOSIS — I6350 Cerebral infarction due to unspecified occlusion or stenosis of unspecified cerebral artery: Secondary | ICD-10-CM

## 2012-08-02 DIAGNOSIS — I4891 Unspecified atrial fibrillation: Secondary | ICD-10-CM

## 2012-08-02 DIAGNOSIS — I639 Cerebral infarction, unspecified: Secondary | ICD-10-CM

## 2012-08-02 DIAGNOSIS — Z7901 Long term (current) use of anticoagulants: Secondary | ICD-10-CM

## 2012-08-02 DIAGNOSIS — Z954 Presence of other heart-valve replacement: Secondary | ICD-10-CM

## 2012-08-23 ENCOUNTER — Ambulatory Visit (INDEPENDENT_AMBULATORY_CARE_PROVIDER_SITE_OTHER): Payer: Self-pay | Admitting: Pharmacist Clinician (PhC)/ Clinical Pharmacy Specialist

## 2012-08-23 DIAGNOSIS — Z952 Presence of prosthetic heart valve: Secondary | ICD-10-CM

## 2012-08-23 DIAGNOSIS — I6350 Cerebral infarction due to unspecified occlusion or stenosis of unspecified cerebral artery: Secondary | ICD-10-CM

## 2012-08-23 DIAGNOSIS — Z954 Presence of other heart-valve replacement: Secondary | ICD-10-CM

## 2012-08-23 DIAGNOSIS — Z7901 Long term (current) use of anticoagulants: Secondary | ICD-10-CM

## 2012-08-23 DIAGNOSIS — I4891 Unspecified atrial fibrillation: Secondary | ICD-10-CM

## 2012-08-23 DIAGNOSIS — I639 Cerebral infarction, unspecified: Secondary | ICD-10-CM

## 2012-09-06 ENCOUNTER — Ambulatory Visit (INDEPENDENT_AMBULATORY_CARE_PROVIDER_SITE_OTHER): Payer: Self-pay | Admitting: Pharmacist Clinician (PhC)/ Clinical Pharmacy Specialist

## 2012-09-06 DIAGNOSIS — Z952 Presence of prosthetic heart valve: Secondary | ICD-10-CM

## 2012-09-06 DIAGNOSIS — Z7901 Long term (current) use of anticoagulants: Secondary | ICD-10-CM

## 2012-09-06 DIAGNOSIS — I639 Cerebral infarction, unspecified: Secondary | ICD-10-CM

## 2012-09-06 DIAGNOSIS — I4891 Unspecified atrial fibrillation: Secondary | ICD-10-CM

## 2012-09-06 DIAGNOSIS — I6350 Cerebral infarction due to unspecified occlusion or stenosis of unspecified cerebral artery: Secondary | ICD-10-CM

## 2012-09-06 DIAGNOSIS — Z954 Presence of other heart-valve replacement: Secondary | ICD-10-CM

## 2012-10-05 ENCOUNTER — Telehealth: Payer: Self-pay | Admitting: Cardiovascular Disease

## 2012-10-05 ENCOUNTER — Ambulatory Visit (INDEPENDENT_AMBULATORY_CARE_PROVIDER_SITE_OTHER): Payer: Medicare Other | Admitting: Pharmacist Clinician (PhC)/ Clinical Pharmacy Specialist

## 2012-10-05 DIAGNOSIS — Z954 Presence of other heart-valve replacement: Secondary | ICD-10-CM

## 2012-10-05 DIAGNOSIS — Z952 Presence of prosthetic heart valve: Secondary | ICD-10-CM

## 2012-10-05 DIAGNOSIS — I6350 Cerebral infarction due to unspecified occlusion or stenosis of unspecified cerebral artery: Secondary | ICD-10-CM

## 2012-10-05 DIAGNOSIS — Z7901 Long term (current) use of anticoagulants: Secondary | ICD-10-CM

## 2012-10-05 DIAGNOSIS — I639 Cerebral infarction, unspecified: Secondary | ICD-10-CM

## 2012-10-05 DIAGNOSIS — I4891 Unspecified atrial fibrillation: Secondary | ICD-10-CM

## 2012-10-05 LAB — PROTIME-INR: INR: 3.8 — AB (ref <=1.1)

## 2012-10-05 NOTE — Telephone Encounter (Signed)
Spoke with spouse, see anticoagulation encounter

## 2012-10-05 NOTE — Telephone Encounter (Signed)
Need to know the dosage of her inr .. Please call   Thanks

## 2012-10-25 ENCOUNTER — Ambulatory Visit (INDEPENDENT_AMBULATORY_CARE_PROVIDER_SITE_OTHER): Payer: Medicare Other | Admitting: Pharmacist Clinician (PhC)/ Clinical Pharmacy Specialist

## 2012-10-25 DIAGNOSIS — Z954 Presence of other heart-valve replacement: Secondary | ICD-10-CM

## 2012-10-25 DIAGNOSIS — I6350 Cerebral infarction due to unspecified occlusion or stenosis of unspecified cerebral artery: Secondary | ICD-10-CM

## 2012-10-25 DIAGNOSIS — Z7901 Long term (current) use of anticoagulants: Secondary | ICD-10-CM

## 2012-10-25 DIAGNOSIS — Z952 Presence of prosthetic heart valve: Secondary | ICD-10-CM

## 2012-10-25 DIAGNOSIS — I639 Cerebral infarction, unspecified: Secondary | ICD-10-CM

## 2012-10-25 DIAGNOSIS — I4891 Unspecified atrial fibrillation: Secondary | ICD-10-CM

## 2012-10-25 LAB — PROTIME-INR: INR: 3.7 — AB (ref <=1.1)

## 2012-11-07 ENCOUNTER — Other Ambulatory Visit: Payer: Self-pay | Admitting: Pharmacist Clinician (PhC)/ Clinical Pharmacy Specialist

## 2012-11-07 MED ORDER — WARFARIN SODIUM 5 MG PO TABS: ORAL_TABLET | ORAL | Status: AC

## 2012-11-27 ENCOUNTER — Telehealth: Payer: Self-pay | Admitting: Cardiovascular Disease

## 2012-11-27 NOTE — Telephone Encounter (Signed)
Phone was picked up, could hear [name] [name] in the background.

## 2012-11-27 NOTE — Telephone Encounter (Signed)
Wants [name] [name] to call him regarding her coumadin.

## 2012-11-28 NOTE — Telephone Encounter (Signed)
Husband was concerned about INR monitoring, now being done in [place].  Was unsure if 2.4 was okay level, as he thought Dr. [name] had always wanted the level at 3.  Explained to pt that her goal is 3.0 because of valve, but that 2.5-3.5 was the acceptable range for her.  If her INR was 2.4, the MD was correct in not adjusting dose and they need not worry.  Pt thankful for explanation.

## 2013-02-01 ENCOUNTER — Ambulatory Visit: Payer: Self-pay | Admitting: Pharmacist Clinician (PhC)/ Clinical Pharmacy Specialist

## 2013-02-01 DIAGNOSIS — I639 Cerebral infarction, unspecified: Secondary | ICD-10-CM

## 2013-02-01 DIAGNOSIS — I4891 Unspecified atrial fibrillation: Secondary | ICD-10-CM

## 2013-02-01 DIAGNOSIS — Z7901 Long term (current) use of anticoagulants: Secondary | ICD-10-CM

## 2013-02-01 DIAGNOSIS — Z952 Presence of prosthetic heart valve: Secondary | ICD-10-CM

## 2013-03-15 ENCOUNTER — Other Ambulatory Visit: Payer: Self-pay | Admitting: *Deleted

## 2013-03-15 MED ORDER — POLYSACCHARIDE IRON COMPLEX 150 MG PO CAPS: 150.0000 mg | ORAL_CAPSULE | ORAL | Status: AC

## 2013-03-15 NOTE — Telephone Encounter (Signed)
Rx was sent to pharmacy electronically. 

## 2013-05-20 ENCOUNTER — Other Ambulatory Visit: Payer: Self-pay | Admitting: *Deleted

## 2013-05-20 MED ORDER — BUMETANIDE 2 MG PO TABS: 2.0000 mg | ORAL_TABLET | Freq: Two times a day (BID) | ORAL | Status: AC

## 2013-05-20 NOTE — Telephone Encounter (Signed)
Rx refill sent to patient pharmacy   

## 2014-08-11 DEATH — deceased
